# Patient Record
Sex: Female | Born: 1935 | Race: White | Hispanic: No | Marital: Married | Smoking: Never smoker
Health system: Southern US, Community
[De-identification: ages and names within clinical notes are randomized; demographics above are authoritative.]

## PROBLEM LIST (undated history)

## (undated) DIAGNOSIS — H548 Legal blindness, as defined in USA: Secondary | ICD-10-CM

## (undated) DIAGNOSIS — E039 Hypothyroidism, unspecified: Secondary | ICD-10-CM

## (undated) DIAGNOSIS — I10 Essential (primary) hypertension: Secondary | ICD-10-CM

## (undated) HISTORY — PX: TONSILLECTOMY: SUR1361

## (undated) HISTORY — PX: ABDOMINAL HYSTERECTOMY: SHX81

---

## 1998-08-20 ENCOUNTER — Ambulatory Visit (HOSPITAL_COMMUNITY): Admission: RE | Admit: 1998-08-20 | Discharge: 1998-08-20 | Payer: Self-pay | Admitting: *Deleted

## 1999-06-26 ENCOUNTER — Encounter: Payer: Self-pay | Admitting: *Deleted

## 1999-06-26 ENCOUNTER — Ambulatory Visit (HOSPITAL_COMMUNITY): Admission: RE | Admit: 1999-06-26 | Discharge: 1999-06-26 | Payer: Self-pay | Admitting: *Deleted

## 1999-12-09 ENCOUNTER — Ambulatory Visit (HOSPITAL_COMMUNITY): Admission: RE | Admit: 1999-12-09 | Discharge: 1999-12-09 | Payer: Self-pay | Admitting: *Deleted

## 2000-01-08 ENCOUNTER — Encounter: Payer: Self-pay | Admitting: *Deleted

## 2000-01-08 ENCOUNTER — Ambulatory Visit (HOSPITAL_COMMUNITY): Admission: RE | Admit: 2000-01-08 | Discharge: 2000-01-08 | Payer: Self-pay | Admitting: *Deleted

## 2000-09-27 ENCOUNTER — Inpatient Hospital Stay (HOSPITAL_COMMUNITY): Admission: EM | Admit: 2000-09-27 | Discharge: 2000-10-01 | Payer: Self-pay | Admitting: *Deleted

## 2000-09-28 ENCOUNTER — Encounter (HOSPITAL_BASED_OUTPATIENT_CLINIC_OR_DEPARTMENT_OTHER): Payer: Self-pay | Admitting: Internal Medicine

## 2001-01-03 ENCOUNTER — Ambulatory Visit (HOSPITAL_COMMUNITY): Admission: RE | Admit: 2001-01-03 | Discharge: 2001-01-03 | Payer: Self-pay | Admitting: *Deleted

## 2002-02-07 ENCOUNTER — Ambulatory Visit (HOSPITAL_COMMUNITY): Admission: RE | Admit: 2002-02-07 | Discharge: 2002-02-07 | Payer: Self-pay | Admitting: *Deleted

## 2002-11-07 ENCOUNTER — Encounter: Admission: RE | Admit: 2002-11-07 | Discharge: 2002-11-07 | Payer: Self-pay | Admitting: Internal Medicine

## 2002-11-07 ENCOUNTER — Encounter: Payer: Self-pay | Admitting: Internal Medicine

## 2003-01-23 ENCOUNTER — Encounter: Payer: Self-pay | Admitting: Internal Medicine

## 2003-01-23 ENCOUNTER — Encounter: Admission: RE | Admit: 2003-01-23 | Discharge: 2003-01-23 | Payer: Self-pay | Admitting: Internal Medicine

## 2003-02-05 ENCOUNTER — Encounter: Admission: RE | Admit: 2003-02-05 | Discharge: 2003-05-06 | Payer: Self-pay | Admitting: Internal Medicine

## 2003-02-06 ENCOUNTER — Encounter: Admission: RE | Admit: 2003-02-06 | Discharge: 2003-02-06 | Payer: Self-pay | Admitting: Internal Medicine

## 2003-02-06 ENCOUNTER — Encounter: Payer: Self-pay | Admitting: Internal Medicine

## 2003-12-05 ENCOUNTER — Ambulatory Visit (HOSPITAL_COMMUNITY): Admission: RE | Admit: 2003-12-05 | Discharge: 2003-12-05 | Payer: Self-pay | Admitting: Internal Medicine

## 2004-03-13 ENCOUNTER — Emergency Department (HOSPITAL_COMMUNITY): Admission: EM | Admit: 2004-03-13 | Discharge: 2004-03-13 | Payer: Self-pay | Admitting: Emergency Medicine

## 2007-10-01 ENCOUNTER — Inpatient Hospital Stay (HOSPITAL_COMMUNITY): Admission: EM | Admit: 2007-10-01 | Discharge: 2007-10-05 | Payer: Self-pay | Admitting: Emergency Medicine

## 2008-04-09 ENCOUNTER — Encounter: Admission: RE | Admit: 2008-04-09 | Discharge: 2008-04-09 | Payer: Self-pay | Admitting: Internal Medicine

## 2010-11-02 ENCOUNTER — Encounter: Admission: RE | Admit: 2010-11-02 | Discharge: 2010-11-02 | Payer: Self-pay | Admitting: Internal Medicine

## 2011-04-13 NOTE — Discharge Summary (Signed)
NAMEDAKOTA, STANGL                  ACCOUNT NO.:  [id number]   MEDICAL RECORD NO.:  [id number]          PATIENT TYPE:  INP   LOCATION:  5008                         FACILITY:  [facility]   PHYSICIAN:  Mark C. Ophelia Charter, M.D.    DATE OF BIRTH:  June 14, 1916   DATE OF ADMISSION:  10/01/2007  DATE OF DISCHARGE:  10/05/2007                               DISCHARGE SUMMARY   TRANSFER SUMMARY   FINAL DIAGNOSIS:  Fall with left supracondylar humerus fracture.   ADDITIONAL DIAGNOSES:  1. Patient legally blind.  2. Positive for hypertension.  3. Positive for decreased hearing acuity.   HISTORY:  This 77 year old female was leaving church with her husband  when she lost her balance.  He was holding her by her hand.  She turned,  twisted, fell, landing on her outstretched arm suffering a left  supracondylar humerus fracture.  She was brought to the emergency room  by EMS where x-rays were taken demonstrating the supracondylar  comminuted humerus fracture.   ADMISSION LABS INCLUDED:  CBC with a hemoglobin of 14.5, white count  9000, and normal platelets.  Chemistry panel was unremarkable other than  a glucose of 154.  Ulnar, radial, and median nerve exam at the time of  her injury showed that all nerves were intact.  She had good pulses.   HOSPITAL COURSE:  The patient was taken to the operating room and  underwent open reduction internal fixation of the left humerus with an  olecranon osteotomy.  Postoperatively she was taking Vicodin for pain.  Arrangements were made for home health, PT, bath aide, etc.  Her husband  works, and unfortunately there would be times that she would be home  alone, and with only one arm (she normally used a walker), but with her  legally blind condition, decreased balance, only one arm, and a walker,  she was at significant fall risk and not safe for physical therapy  evaluation.   She was kept in the hospital and remained there pending nursing home  arrangements.   Arrangements were made with [employer] for transfer  on October 05, 2007.  The patient was taken Vicodin ES for pain.  Dulcolax suppositories were ordered for bowel movement on November 5.  She was ambulating with physical therapy assistance, but needed to be  assisted because she is a high-fall risk.   Follow up with Dr. Ophelia Charter will be in one week from the time of discharge,  for splint removal and x-rays.  She has had her arm elevated on the  pillow and was tolerating a normal diet.  She was not placed on any DVT  prophylaxis due to blindness, falling history, etc.   DISCHARGE MEDICATIONS:  Same as admission medications which include:  1. Avapro 150 mg one p.o. daily.  2. Lopressor 25 mg p.o. b.i.d.  3. Zestril.  4. Prinivil 80 mg p.o. daily.  5. Synthroid 100 mcg p.o. daily.  6. KCl 10 mEq daily.  7. The only new medication that she was taking was hydrocodone 1-2      tablets p.o. q.4-6  h. p.r.n. pain.      Mark C. Ophelia Charter, M.D.  Electronically Signed     MCY/MEDQ  D:  10/05/2007  T:  10/05/2007  Job:  [id number]

## 2011-04-13 NOTE — Op Note (Signed)
NAMENINI, CAVAN                  ACCOUNT NO.:  [id number]   MEDICAL RECORD NO.:  [id number]          PATIENT TYPE:  INP   LOCATION:  1829                         FACILITY:  [facility]   PHYSICIAN:  Mark C. Ophelia Charter, M.D.    DATE OF BIRTH:  10-20-1916   DATE OF PROCEDURE:  10/01/2007  DATE OF DISCHARGE:                               OPERATIVE REPORT   PRE AND POSTOPERATIVE DIAGNOSIS:  Comminuted left supracondylar fracture  of the humerus.   PROCEDURE:  Open reduction internal fixation left supracondylar fracture  with medial and lateral plating, olecranon osteotomy.   SURGEON:  Annell Greening, MD   TOURNIQUET TIME:  An hour and 30 minutes.   PROCEDURE:  After induction of general anesthesia and orotracheal  intubation, the patient was placed prone on chest rolls.  The arm was  prepped from the shoulder all the way to the wrist, impervious  stockinette, Coban, split sheets, sterile tourniquet after extremity  sheets and sterile skin marker, Betadine Vi-drape was applied, arm was  wrapped in Esmarch tourniquet, tourniquet inflated.  Time-out procedure  was taken confirmed preoperative Ancef was given.  Midline incision was  made.  An olecranon osteotomy was performed after pre drilling with a  0.45 screw, placement of 65, 70 mm lag screw.  Acromed plates were  selected and ulnar nerve was isolated, Penrose drain was placed around  it and a suture around the Penrose to avoid excessive traction.  Nerve  was freed up, taken out of the groove.  __________  was placed in the  triceps and triceps was split medial lateral.  Release the ulnar around  the inner muscular septum was performed, fracture was reduced, held was  some K-wires above the fossa.  There was no T condylar extension, but  there was significant butterfly comminution of both medial and lateral  columns.  Lateral plate was applied first using Acromed and distal  unicortical screws were placed locking and then proximally four  screws  were placed, lagging across the fracture site which was oblique.  Next a  medial plate was fashioned with distal unicortical screws.  Care taken  avoid the penetration into the joint.  There was good cortical bite.  Proximally five more screws were placed.  Plate appeared be down the  bone; however, once all screws were locked down tight, fluoroscopy was  brought in and the arm board that had been folded that was used with  green towels and a Coban wrapped around it to make a bolster to hold the  arm at 90 to allow the elbow to flex for exposure.  The arm board was  removed and C-arm was brought in and then the proximal portion was able  be visualized.  It turned out the plate was off, slightly off the bone  but all locking screws were bicortical down tight.  After irrigation  with saline solution, triceps was repaired, ulnar nerve was transposed  anteriorly with a U-shaped 2 cm wide piece of fascia, leaving the fascia  attached to the medial epicondyle.  Subcutaneous tissue reapproximated  with  2-0 Vicryl and 3-0 Vicryl, skin staple closure, 4x4s, ABDs, Webril  and a long-arm splint with the elbow at 90, wrist at neutral.  Instrument and needle count was correct.  After repair of the triceps on  the sides, the 70 screw was switched for an 85 screw and 2-0 drill bit  was used to drill hole in the cortex of the ulna and a 20 gauge wire was  passed around it and underneath the triceps, next to the screw,  tightened down with square knot, using large needle drivers and  bending it over the round handle of the Cobb.  Screw was tightened down  securely.  Olecranon was stable, able to be flexed and extended prior to  fixing the olecranon and there was good stability of the fracture site  and also of the olecranon with flexion/extension up.  Closure was  performed as described above and splint applied as described.      Mark C. Ophelia Charter, M.D.  Electronically Signed     MCY/MEDQ  D:   10/01/2007  T:  10/02/2007  Job:  [id number]

## 2011-04-16 NOTE — H&P (Signed)
[facility]  Patient:    Cynthia Lynn, Cynthia Lynn                           MRN: [id number] Adm. Date:  09/27/00 Attending:  Lilia Pro, M.D.                         History and Physical  CHIEF COMPLAINT:  Chest pain.  HISTORY OF PRESENT ILLNESS:  This is an 77 year old white female who was under my primary care and came to my office today with a two-day history of chest pain.  She describes this as a tight sensation across her chest and radiating to her left arm.  She has never had pain like this before and denies any shortness of breath, diaphoresis or nausea or vomiting.  She says this is a constant pain with no alleviating or aggravating factors.  She also complains of a severe sore throat and sinus congestion which started about the same time.  PAST MEDICAL HISTORY:  Includes gastroesophageal reflux disease, hiatal hernia, osteopenia, diverticulosis, hypertension, and allergic rhinitis.  PAST SURGICAL HISTORY:  Includes a hysterectomy, a cyst removal of her right lung 50 years ago, tonsillectomy.  PROCEDURES:  Include an EGD in February 2001 with dilatation of her esophagus per Dr. Virginia Rochester.  Colonoscopy 2001 that was negative.  CT of the abdomen in July 2000, negative.  FAMILY AND SOCIAL HISTORY:  She lives with her husband and is independent with her ADLs, driving and babysitting her granddaughter.  She does not smoke nor does she drink.  Family history shows coronary artery disease in her mother who died at age 52.  Father died at age 20 from heart disease.  Her sister has liver cancer.  REVIEW OF SYSTEMS:  GENERAL:  Mild fatigue.  HEENT:  Positive for nasal congestion and sore throat.  CARDIOVASCULAR:  Positive for chest tightness. RESPIRATORY:  Negative.  GASTROINTESTINAL:  Negative.  GENITOURINARY: Negative.  INFECTIOUS DISEASE:  Negative.  MUSCULOSKELETAL:  Negative.  All other systems negative.  PHYSICAL EXAMINATION:  VITAL SIGNS:  Blood  pressure 160/82, she is afebrile.  Her weight is 178, pulse is 75.  GENERAL:  This is a well-developed, white female in no apparent distress.  HEENT:  Pupils, equal, round, reactive to light.  Extraocular movements intact.  Sclerae clear.  Oropharynx with mild redness of her pharynx, otherwise clear.  TMs clear bilaterally.  NECK:  No masses palpable, supple.  LUNGS:  Clear to auscultation bilaterally.  Nonlabored.  ABDOMEN:  Nondistended, nontender, soft, positive bowel sounds.  No hepatosplenomegaly.  Positive ventral hernia.  EXTREMITIES:  No cyanosis.  No clubbing.  No edema.  NEUROLOGICAL:  Cranial nerves II-XII intact.  Strength 5+ throughout.  Normal gait.  LABORATORY DATA:  Shows an ECG with PACs and an LVH with some ST segment changes but no significant change since her last ECG earlier this year.  IMPRESSION AND PLAN: IMPRESSION AND PLAN:  Chest pain.  I would consider this to be an acute coronary syndrome with mild to moderate risk.  I will go ahead and admit her to a telemetry bed.  Check serial cardiac enzymes, place a nitroglycerin patch on her as well as start a beta blocker, aspirin and Lovenox at therapeutic dose.  I will give her doxycycline for her sinus infection and consult Dr. Lucas Mallow to see the patient. DD:  09/27/00 TD:  09/27/00 Job: [id number]

## 2011-04-16 NOTE — Consult Note (Signed)
[facility]  Patient:    Cynthia Lynn, Cynthia Lynn                         MRN: [id number] Proc. Date: 09/28/00 Adm. Date:  [id number] Attending:  Lilia Pro D                          Consultation Report  PRIMARY CARE PHYSICIAN:  Lilia Pro, M.D.  I appreciate the opportunity to participate in the care of this very nice patient, the 77 year old Cynthia Lynn, date of birth 08/01/1916, by providing consultative services, at Dr. Doreen Beam request, in regard to Cynthia Lynn chest pain.  The patient presented to the office complaining of a 2 day history of chest discomfort described as a tight sensation across the chest with radiation to the left arm. She says that the pain was not accompanied by shortness of breath, diaphoresis, nausea or vomiting but was moderate in severity, occurred in the context of a recent sore throat and sinus congestion, and was not modified by physical activity or position.  PAST MEDICAL HISTORY:  Gastroesophageal reflux disease with dilatation of the esophagus in February of this year by Dr. Virginia Rochester, hiatal hernia, osteopenia, diverticulosis, allergic rhinitis and hypertension. She has had a hysterectomy and also had a cyst removed from her right lung 50 years ago. There is a remote tonsillectomy. She says that all her family have recently had respiratory tract infections including both her daughters and her husband.  FAMILY HISTORY:  Her mother died at 43 and had coronary artery disease. Her father died at 52 from heart disease. A sister has liver cancer.  SOCIAL HISTORY:  She lives at home with her husband. She is busy baby sitting for her granddaughter. They live in a house which has 2 levels and 3 steps to enter the home. There is 2 railing and she can stay on ground level if needed.  REVIEW OF SYSTEMS:  CONSTITUTIONAL:  She denies documented fevers at home but feels that she may have had some fever within the last day or two.  She says that her legs occasionally cramp when she walks. EYES:  No diplopia or blurring. She does have impaired vision overall and wears reading glasses. ENT:  She has mild deafness. She has her own teeth. CARDIOVASCULAR:  See the history of the present illness. No PND or orthopnea. RESPIRATORY:  The recent respiratory tract infection with sore throat is noted above. She denies dyspnea on exertion ordinarily and has never smoked. GI:  Occasional symptoms of gastroesophageal reflux. Also occasional constipation and diarrhea last week. GU:  Ongoing problem with urgency, without dysuria. 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  SKIN/BREASTS:  No rash or nodule. NEUROLOGIC: No faintness or syncope. ENDOCRINE:  No known diabetes or thyroid disease. ALLERGIC LYMPHATIC:  No known medication or food allergy.  PHYSICAL EXAMINATION:  VITAL SIGNS:  Blood pressure on arriving at the [facility] is 210/108 with temperature 100.2, pulse 80 and respirations 16. Subsequently her blood pressure has dropped to the 110 to 120 systolic range. Weight 178 pounds, height 5 feet 5 inches.  GENERAL:  She is a well-developed, well-nourished woman who looks younger than her stated age of 76. She is mildly deaf. She is oriented to person, place and time. Her mood and affect are appropriate.  HEENT:  Her conjunctiva and lens reveal no xanthelasma, icterus or arcus senilis. Her teeth  are her own and in fairly good repair. Her tongue is somewhat dry otherwise unremarkable. There is no pallor or cyanosis of the oral mucosa.  NECK:  Supple and symmetric. Trachea is midline and mobile. There is no palpable thyromegaly or cervical node, no carotid bruit or JVD.  LUNGS:  Her respiratory effort is normal at rest. Her lungs reveal minimal prolongation of forced expiratory time without wheezes, rales or rhonchi.  BACK:  Straight and nontender. There is no kyphosis or scoliosis. Her gait is not tested. Her muscle  strength and tone are age appropriate.  HEART:  The cardiac apical impulse is cryptic. The left border of cardiac dullness is within the left anterior axillary line. The heart rhythm is regular and is normal. There is 2-3/6 moderately late apical systolic murmur and a grade 2/6 systolic murmur at the right upper sternal border. The first sound is single and the splitting of the second is uncertain.  EXTREMITIES:  Her digits and nails reveal no clubbing or cyanosis.  SKIN/SUBCUTANEOUS TISSUE:  Reveal no stasis dermatitis or ulcer.  ABDOMEN:  Mildly protuberant and nontender. There is no palpable enlargement of liver or spleen. There is no aortic bruit. The femoral arteries are without bruits. The pedal pulses are difficult to feel through the 1-2+ edema. There is no evidence of ischemia of the feet.  Not mentioned in the review of systems above is that she has a long term problem with ankle edema.  LABORATORY DATA:  Several sensitive cardiac enzymes which are negative. However, she has had 2 EKGs both of which show possible prior anteroseptal or at least septal myocardial infarction.  Cynthia Lynn chest pain is atypical, and I think it is unlikely to represent unstable angina. 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 It would be appropriate to screen her for such a problem with a Persantine cardiolite test, which I have scheduled for her. Meanwhile, of course, she should continue on appropriate treatment for her respiratory tract infection. If she develops additional chest pain especially if associated with new ST abnormalities in the electrocardiogram then a more aggressive approach would become appropriate.  I again appreciate the opportunity to participate in the care of this nice woman. The results of her test will of course determine our further course of action. DD:   09/28/00 TD:  09/28/00 Job: [id number]

## 2011-04-16 NOTE — Discharge Summary (Signed)
[facility]  Patient:    Cynthia Lynn, Cynthia Lynn                         MRN: [id number] Adm. Date:  [id number] Disc. Date: [id number] Attending:  Lilia Pro D                           Discharge Summary  ADMISSION DIAGNOSES: 1. Chest pain. 2. Upper respiratory infection.  DISCHARGE DIAGNOSES: 1. Chest pain with negative cardiac work-up. 2. Upper respiratory infection.  PROCEDURE:  Stress Cardiolite scan which was normal.  HISTORY OF PRESENT ILLNESS:  This is an 77 year old white female who under my primary care came to the office with a two day history of chest pain which she described as a tightness across her chest and radiating to her left arm.  She stated that she had never had any pain like this but denied any shortness of breath, diaphoresis or nausea and vomiting.  PHYSICAL EXAMINATION:  On physical exam, she was noted to have a very soft systolic murmur.  Otherwise, lungs were clear.  The rest of her exam was benign.  HOSPITAL COURSE:  The patient was admitted to [facility] to a telemetry bed. Serial cardiac enzymes were done which were negative and cardiology was consulted.  Dr. Lucas Mallow saw the patient and felt that the stress Cardiolite exam was indicated because the Q-waves on her EKG indicated possible old infarct and because of her risk factors.  A stress Cardiolite scan was done.  This was read as negative.  The patient stated that her symptoms improved.  She denied any chest pain on the day of discharge and said her upper respiratory symptoms were much improved on Augmentin.  She was given a five day course of Augmentin p.o.  She was stable on discharge.  PLAN:  Follow-up will be with Dr. Lilia Pro in two to three weeks. The patient is going to have cataract surgery on Tuesday, October 04, 2000, which she is cleared for. DD:  10/01/00 TD:  10/02/00 Job: [id number]

## 2011-09-07 LAB — DIFFERENTIAL
Basophils Relative: 1
Eosinophils Absolute: 0
Eosinophils Relative: 1
Lymphocytes Relative: 12
Monocytes Absolute: 0.3
Monocytes Relative: 3

## 2011-09-07 LAB — CBC
HCT: 42.4
MCHC: 34.2
WBC: 9

## 2011-09-07 LAB — I-STAT 8, (EC8 V) (CONVERTED LAB)
Bicarbonate: 25.4 — ABNORMAL HIGH
Glucose, Bld: 154 — ABNORMAL HIGH
HCT: 44
Operator id: 285491
Sodium: 137
TCO2: 27

## 2011-09-07 LAB — POCT I-STAT CREATININE: Operator id: 285491

## 2012-04-08 ENCOUNTER — Emergency Department (INDEPENDENT_AMBULATORY_CARE_PROVIDER_SITE_OTHER)
Admission: EM | Admit: 2012-04-08 | Discharge: 2012-04-08 | Disposition: A | Discharge status: Home / Self Care | Payer: Medicare Other | Source: Home / Self Care | Attending: Emergency Medicine | Admitting: Emergency Medicine

## 2012-04-08 ENCOUNTER — Encounter (HOSPITAL_COMMUNITY): Payer: Self-pay | Admitting: Emergency Medicine

## 2012-04-08 DIAGNOSIS — I10 Essential (primary) hypertension: Secondary | ICD-10-CM

## 2012-04-08 DIAGNOSIS — N390 Urinary tract infection, site not specified: Secondary | ICD-10-CM

## 2012-04-08 HISTORY — DX: Essential (primary) hypertension: I10

## 2012-04-08 LAB — POCT I-STAT, CHEM 8
BUN: 16 mg/dL (ref 6–23)
Chloride: 103 mEq/L (ref 96–112)
Creatinine, Ser: 0.9 mg/dL (ref 0.50–1.10)
HCT: 48 % — ABNORMAL HIGH (ref 36.0–46.0)
Potassium: 4 mEq/L (ref 3.5–5.1)

## 2012-04-08 LAB — POCT URINALYSIS DIP (DEVICE)
Nitrite: POSITIVE — AB
Specific Gravity, Urine: 1.01 (ref 1.005–1.030)

## 2012-04-08 MED ORDER — LISINOPRIL 10 MG PO TABS: 20.0000 mg | ORAL_TABLET | Freq: Every day | ORAL | Status: DC

## 2012-04-08 MED ORDER — CLONIDINE HCL 0.1 MG PO TABS
0.1000 mg | ORAL_TABLET | Freq: Once | ORAL | Status: AC
  Administered 2012-04-08: 0.1 mg via ORAL

## 2012-04-08 MED ORDER — CEPHALEXIN 500 MG PO CAPS: 500.0000 mg | ORAL_CAPSULE | Freq: Three times a day (TID) | ORAL | Status: AC

## 2012-04-08 MED ORDER — CLONIDINE HCL 0.1 MG PO TABS
ORAL_TABLET | ORAL | Status: AC
  Filled 2012-04-08: qty 1

## 2012-04-08 NOTE — Discharge Instructions (Signed)
Double your dose of lisinopril to 20 mg daily (2 pills).  See your primary care doctor this week for a blood pressure recheck.  Finish up your entire prescription of antibiotics and get another urine culture afterwards.

## 2012-04-08 NOTE — ED Provider Notes (Signed)
Chief Complaint  Patient presents with  . Blood Pressure Check    History of Present Illness:   [name] is a 78 year old female who has had a one-week history of feeling tired, fatigue, and rundown. She also notes urinary frequency and at times incontinence. She's had no dysuria, fever, chills, or lower back pain. She called the fire department this morning because of her fatigue and tiredness and they checked her blood pressure and found it to be 142/92, and recommended that she get checked out. She is on blood pressure medication but doesn't think her blood pressures usually that high. She denies any abdominal pain, nausea, or vomiting. She denies any shortness of breath, PND, orthopnea. She does have ankle swelling. She also has noted some tightness in her chest over the past 3 or 4 weeks. This can last for hours at a time. It's not exertional and not associated with shortness of breath, nausea, or diaphoresis. She denies any PND or orthopnea.  Review of Systems:  Other than noted above, the patient denies any of the following symptoms. Systemic:  No fever, chills, sweats, fatigue, myalgias, headache, or anorexia. Eye:  No redness, pain or drainage. ENT:  No earache, nasal congestion, rhinorrhea, sinus pressure, or sore throat. Lungs:  No cough, sputum production, wheezing, shortness of breath.  Cardiovascular:  No chest pain, palpitations, or syncope. GI:  No nausea, vomiting, abdominal pain or diarrhea. GU:  No dysuria, frequency, or hematuria. Skin:  No rash or pruritis.  PMFSH:  Past medical history, family history, social history, meds, and allergies were reviewed.  Physical Exam:   Vital signs:  BP 183/78  Pulse 68  Temp(Src) 97.8 F (36.6 C) (Oral)  Resp 18  SpO2 98% Filed Vitals:   04/08/12 1758 04/08/12 1802 04/08/12 1919  BP: 189/94 195/66 183/78  Pulse: 62  68  Temp: 97.8 F (36.6 C)    TempSrc: Oral    Resp: 18  18  SpO2: 98%  98%   General:  Alert, in no  distress. Eye:  PERRL, full EOMs.  Lids and conjunctivas were normal. ENT:  TMs and canals were normal, without erythema or inflammation.  Nasal mucosa was clear and uncongested, without drainage.  Mucous membranes were moist.  Pharynx was clear, without exudate or drainage.  There were no oral ulcerations or lesions. Neck:  Supple, no adenopathy, tenderness or mass. Thyroid was normal. Lungs:  No respiratory distress.  Lungs were clear to auscultation, without wheezes, rales or rhonchi.  Breath sounds were clear and equal bilaterally. Heart:  Regular rhythm, without gallops, murmers or rubs. Abdomen:  Soft, flat, and non-tender to palpation.  No hepatosplenomagaly or mass. Skin:  Clear, warm, and dry, without rash or lesions. Neuro exam:  Neurological examination: The patient is alert and oriented x3. Speech is clear, fluent, and appropriate. Cranial nerves are intact. There is no pronator drift and finger to nose was normal. Muscle strength, sensation, and DTRs are normal. Babinskis are downgoing.    Date: 04/08/2012  Rate: 67  Rhythm: normal sinus rhythm  QRS Axis: normal  Intervals: PR prolonged  ST/T Wave abnormalities: nonspecific ST changes  Conduction Disutrbances:none  Narrative Interpretation: Sinus rhythm with first degree AV block, left ventricular hypertrophy with repolarization abnormality, otherwise normal. Unchanged from a previous EKG of November 2001.  Old EKG Reviewed: unchanged    Labs:   Results for orders placed during the hospital encounter of 04/08/12  POCT URINALYSIS DIP (DEVICE)      Component  Value Range   Glucose, UA NEGATIVE  NEGATIVE (mg/dL)   Bilirubin Urine NEGATIVE  NEGATIVE    Ketones, ur NEGATIVE  NEGATIVE (mg/dL)   Specific Gravity, Urine 1.010  1.005 - 1.030    Hgb urine dipstick TRACE (*) NEGATIVE    pH 7.5  5.0 - 8.0    Protein, ur NEGATIVE  NEGATIVE (mg/dL)   Urobilinogen, UA 0.2  0.0 - 1.0 (mg/dL)   Nitrite POSITIVE (*) NEGATIVE     Leukocytes, UA SMALL (*) NEGATIVE   POCT I-STAT, CHEM 8      Component Value Range   Sodium 137  135 - 145 (mEq/L)   Potassium 4.0  3.5 - 5.1 (mEq/L)   Chloride 103  96 - 112 (mEq/L)   BUN 16  6 - 23 (mg/dL)   Creatinine, Ser 1.32  0.50 - 1.10 (mg/dL)   Glucose, Bld 440 (*) 70 - 99 (mg/dL)   Calcium, Ion 1.02  7.25 - 1.32 (mmol/L)   TCO2 26  0 - 100 (mmol/L)   Hemoglobin 16.3 (*) 12.0 - 15.0 (g/dL)   HCT 36.6 (*) 44.0 - 46.0 (%)     Other Labs Obtained at Urgent Care Center:  A urine culture was obtained.  Results are pending at this time and we will call about any positive results.  Course in Urgent Care Center:   She was given clonidine 0.1 mg by mouth and tolerated this well without any immediate side effects. Her blood pressure came down slightly as demonstrated above.   Assessment:  The primary encounter diagnosis was UTI (lower urinary tract infection). A diagnosis of Hypertension was also pertinent to this visit. I think or tiredness and fatigue is due to her urinary tract infection. Her blood pressure appears to be very labile. I don't think this is the cause of her symptoms. It does need better control. I don't see any evidence of cardiac disease.  Plan:   1.  The following meds were prescribed:   New Prescriptions   CEPHALEXIN (KEFLEX) 500 MG CAPSULE    Take 1 capsule (500 mg total) by mouth 3 (three) times daily.   2.  The patient was instructed in symptomatic care and handouts were given. 3.  The patient was told to return if becoming worse in any way, if no better in 3 or 4 days, and given some red flag symptoms that would indicate earlier return. The patient was told to increase her lisinopril to 20 mg per day, followup with her primary care physician next week for a blood pressure, and complete her course of antibiotics with a repeat culture after finishing the antibiotics.    Reuben Likes, MD 04/08/12 [id number]

## 2012-04-08 NOTE — ED Notes (Signed)
Pt was advised to go to her doctor this morning due to a blood pressure of 142/94. Pt blood pressure is now even more elevated. Pt also c/o dysuria and increased incontinence.

## 2012-04-11 LAB — URINE CULTURE
Colony Count: >=100000
Culture  Setup Time: 201305120206
Special Requests: NORMAL

## 2012-04-13 NOTE — ED Notes (Signed)
Urine culture: > 100,000 colonies E. Coli. Pt. adequately treated with Keflex. Cynthia Lynn 04/13/2012  

## 2013-01-17 ENCOUNTER — Encounter (HOSPITAL_COMMUNITY): Payer: Self-pay | Admitting: Emergency Medicine

## 2013-01-17 ENCOUNTER — Emergency Department (HOSPITAL_COMMUNITY): Payer: Medicare Other

## 2013-01-17 ENCOUNTER — Inpatient Hospital Stay (HOSPITAL_COMMUNITY)
Admission: EM | Admit: 2013-01-17 | Discharge: 2013-01-22 | DRG: 690 | Disposition: A | Discharge status: Home Health | Payer: Medicare Other | Attending: Internal Medicine | Admitting: Internal Medicine

## 2013-01-17 DIAGNOSIS — I498 Other specified cardiac arrhythmias: Secondary | ICD-10-CM | POA: Diagnosis present

## 2013-01-17 DIAGNOSIS — A498 Other bacterial infections of unspecified site: Secondary | ICD-10-CM | POA: Diagnosis present

## 2013-01-17 DIAGNOSIS — N390 Urinary tract infection, site not specified: Principal | ICD-10-CM | POA: Diagnosis present

## 2013-01-17 DIAGNOSIS — H548 Legal blindness, as defined in USA: Secondary | ICD-10-CM | POA: Diagnosis present

## 2013-01-17 DIAGNOSIS — E039 Hypothyroidism, unspecified: Secondary | ICD-10-CM | POA: Diagnosis present

## 2013-01-17 DIAGNOSIS — K5289 Other specified noninfective gastroenteritis and colitis: Secondary | ICD-10-CM | POA: Diagnosis present

## 2013-01-17 DIAGNOSIS — N179 Acute kidney failure, unspecified: Secondary | ICD-10-CM | POA: Diagnosis present

## 2013-01-17 DIAGNOSIS — D72829 Elevated white blood cell count, unspecified: Secondary | ICD-10-CM | POA: Diagnosis present

## 2013-01-17 DIAGNOSIS — E860 Dehydration: Secondary | ICD-10-CM | POA: Diagnosis present

## 2013-01-17 DIAGNOSIS — Z79899 Other long term (current) drug therapy: Secondary | ICD-10-CM

## 2013-01-17 DIAGNOSIS — Z993 Dependence on wheelchair: Secondary | ICD-10-CM

## 2013-01-17 DIAGNOSIS — Z7982 Long term (current) use of aspirin: Secondary | ICD-10-CM

## 2013-01-17 DIAGNOSIS — I10 Essential (primary) hypertension: Secondary | ICD-10-CM | POA: Diagnosis present

## 2013-01-17 HISTORY — DX: Legal blindness, as defined in USA: H54.8

## 2013-01-17 HISTORY — DX: Hypothyroidism, unspecified: E03.9

## 2013-01-17 LAB — BASIC METABOLIC PANEL
BUN: 42 mg/dL — ABNORMAL HIGH (ref 6–23)
Calcium: 9.7 mg/dL (ref 8.4–10.5)
Creatinine, Ser: 1.12 mg/dL — ABNORMAL HIGH (ref 0.50–1.10)
GFR calc Af Amer: 47 mL/min — ABNORMAL LOW (ref >90)
GFR calc non Af Amer: 41 mL/min — ABNORMAL LOW (ref >90)
Glucose, Bld: 143 mg/dL — ABNORMAL HIGH (ref 70–99)

## 2013-01-17 LAB — CBC WITH DIFFERENTIAL/PLATELET
Basophils Relative: 0 % (ref 0–1)
Eosinophils Absolute: 0.1 10*3/uL (ref 0.0–0.7)
Eosinophils Relative: 1 % (ref 0–5)
HCT: 42 % (ref 36.0–46.0)
Hemoglobin: 15 g/dL (ref 12.0–15.0)
Lymphs Abs: 1.2 10*3/uL (ref 0.7–4.0)
MCH: 32.8 pg (ref 26.0–34.0)
MCHC: 35.7 g/dL (ref 30.0–36.0)
MCV: 91.7 fL (ref 78.0–100.0)
Monocytes Absolute: 1.3 10*3/uL — ABNORMAL HIGH (ref 0.1–1.0)
Monocytes Relative: 7 % (ref 3–12)
Neutrophils Relative %: 86 % — ABNORMAL HIGH (ref 43–77)
RBC: 4.58 MIL/uL (ref 3.87–5.11)

## 2013-01-17 LAB — URINALYSIS, ROUTINE W REFLEX MICROSCOPIC
Ketones, ur: 15 mg/dL — AB
Nitrite: POSITIVE — AB
Specific Gravity, Urine: 1.021 (ref 1.005–1.030)
Urobilinogen, UA: 1 mg/dL (ref 0.0–1.0)

## 2013-01-17 LAB — URINE MICROSCOPIC-ADD ON

## 2013-01-17 MED ORDER — SODIUM CHLORIDE 0.9 % IV SOLN
INTRAVENOUS | Status: DC
  Administered 2013-01-17: 22:00:00 via INTRAVENOUS

## 2013-01-17 MED ORDER — IOHEXOL 300 MG/ML  SOLN
80.0000 mL | Freq: Once | INTRAMUSCULAR | Status: AC | PRN
  Administered 2013-01-17: 80 mL via INTRAVENOUS

## 2013-01-17 MED ORDER — DEXTROSE 5 % IV SOLN
1.0000 g | Freq: Once | INTRAVENOUS | Status: AC
  Administered 2013-01-18: 1 g via INTRAVENOUS
  Filled 2013-01-17: qty 10

## 2013-01-17 MED ORDER — SODIUM CHLORIDE 0.9 % IV BOLUS (SEPSIS)
500.0000 mL | Freq: Once | INTRAVENOUS | Status: AC
  Administered 2013-01-17: 500 mL via INTRAVENOUS

## 2013-01-17 MED ORDER — IOHEXOL 300 MG/ML  SOLN
50.0000 mL | Freq: Once | INTRAMUSCULAR | Status: AC | PRN
  Administered 2013-01-17: 50 mL via ORAL

## 2013-01-17 NOTE — ED Provider Notes (Signed)
History     CSN: [id number]  Arrival date & time 01/17/13  5621   First MD Initiated Contact with Patient 01/17/13 1954      Chief Complaint  Patient presents with  . Weakness  . Diarrhea  . Abdominal Pain    (Consider location/radiation/quality/duration/timing/severity/associated sxs/prior treatment) HPI Comments: TAWANNA FUNK is a 79 y.o. female who is here for abrupt onset of profuse diarrhea. She does not know the color of the diarrhea, because she is legally blind.  She has had mild, ongoing, right lower quadrant abdominal pain for several weeks. She saw her doctor about it last month, and was told that it was her "diverticulitis". She had one episode of vomiting tonight after being nauseated. She denies fever, chills, cough, shortness of breath, chest pain, back pain, or dizziness. She was too weak to walk after she had the diarrhea tonight. She called EMS, for transport here to be evaluated. She does not know of any toxic or sick exposures. There are no other modifying factors.  Patient is a 79 y.o. female presenting with weakness, diarrhea, and abdominal pain. The history is provided by the patient.  Weakness Associated symptoms include abdominal pain.  Diarrhea Associated symptoms: abdominal pain   Abdominal Pain Associated symptoms: diarrhea     Past Medical History  Diagnosis Date  . Hypertension     History reviewed. No pertinent past surgical history.  History reviewed. No pertinent family history.  History  Substance Use Topics  . Smoking status: Never Smoker   . Smokeless tobacco: Not on file  . Alcohol Use: No    OB History   Grav Para Term Preterm Abortions TAB SAB Ect Mult Living                  Review of Systems  Gastrointestinal: Positive for abdominal pain and diarrhea.  Neurological: Positive for weakness.  All other systems reviewed and are negative.    Allergies  Review of patient's allergies indicates no known allergies.  Home  Medications   Current Outpatient Rx  Name  Route  Sig  Dispense  Refill  . aspirin 325 MG tablet   Oral   Take 325 mg by mouth daily.         Marland Kitchen levothyroxine (SYNTHROID, LEVOTHROID) 50 MCG tablet   Oral   Take 50 mcg by mouth daily.         Marland Kitchen lisinopril (PRINIVIL,ZESTRIL) 10 MG tablet   Oral   Take 2 tablets (20 mg total) by mouth daily.   60 tablet   0   . metoprolol (LOPRESSOR) 50 MG tablet   Oral   Take 50 mg by mouth 2 (two) times daily.         . potassium & sodium phosphates (PHOS-NAK) 280-160-250 MG PACK   Oral   Take 1 packet by mouth every morning.           BP 163/49  Pulse 53  Temp(Src) 97 F (36.1 C) (Rectal)  Resp 16  SpO2 100%  Physical Exam  Nursing note and vitals reviewed. Constitutional: She is oriented to person, place, and time. She appears well-developed.  Elderly, frail  HENT:  Head: Normocephalic and atraumatic.  Eyes: Conjunctivae and EOM are normal. Pupils are equal, round, and reactive to light.  Neck: Normal range of motion and phonation normal. Neck supple.  Cardiovascular: Normal rate, regular rhythm and intact distal pulses.   Pulmonary/Chest: Effort normal and breath sounds normal. She exhibits no  tenderness.  Abdominal: Soft. She exhibits no distension. There is no tenderness. There is no guarding.  Hyperactive bowel sounds, soft, mild, right lower quadrant abdominal tenderness.  Musculoskeletal: Normal range of motion.  Neurological: She is alert and oriented to person, place, and time. She has normal strength. She exhibits normal muscle tone.  Skin: Skin is warm and dry.  Psychiatric: She has a normal mood and affect. Her behavior is normal. Judgment and thought content normal.    ED Course  Procedures (including critical care time)  Emergency department treatment: IV fluid was entered.  IV, Rocephin, for UTI.  CT scan ordered to rule out diverticulitis   Reevaluation: 22:55.-She is tolerating her oral contrast,  without vomiting.  Case discussed with hospitalist, to arrange admission.   Date: 09/15/2012  Rate: 47  Rhythm: sinus bradycardia  QRS Axis: normal  PR and QT Intervals: normal  ST/T Wave abnormalities: normal  PR and QRS Conduction Disutrbances:nonspecific intraventricular conduction delay  Narrative Interpretation:   Old EKG Reviewed: changes noted-slight changing QRS, nonspecific     Labs Reviewed  CBC WITH DIFFERENTIAL - Abnormal; Notable for the following:    WBC 18.3 (*)    Neutrophils Relative 86 (*)    Neutro Abs 15.7 (*)    Lymphocytes Relative 7 (*)    Monocytes Absolute 1.3 (*)    All other components within normal limits  BASIC METABOLIC PANEL - Abnormal; Notable for the following:    Glucose, Bld 143 (*)    BUN 42 (*)    Creatinine, Ser 1.12 (*)    GFR calc non Af Amer 41 (*)    GFR calc Af Amer 47 (*)    All other components within normal limits  URINALYSIS, ROUTINE W REFLEX MICROSCOPIC - Abnormal; Notable for the following:    Color, Urine RED (*)    APPearance TURBID (*)    Hgb urine dipstick SMALL (*)    Bilirubin Urine MODERATE (*)    Ketones, ur 15 (*)    Protein, ur 100 (*)    Nitrite POSITIVE (*)    Leukocytes, UA LARGE (*)    All other components within normal limits  URINE MICROSCOPIC-ADD ON - Abnormal; Notable for the following:    Bacteria, UA MANY (*)    Casts HYALINE CASTS (*)    All other components within normal limits  URINE CULTURE   Ct Abdomen Pelvis W Contrast  01/18/2013  *RADIOLOGY REPORT*  Clinical Data: Diarrhea, abdominal pain  CT ABDOMEN AND PELVIS WITH CONTRAST  Technique:  Multidetector CT imaging of the abdomen and pelvis was performed following the standard protocol during bolus administration of intravenous contrast.  Contrast: 80mL OMNIPAQUE IOHEXOL 300 MG/ML  SOLN  Comparison: None.  Findings: Mild right lung base opacity and trace fluid. Cardiomegaly.  Coarse mitral annular calcification.  Small hiatal hernia.   Unremarkable liver, spleen, pancreas, right adrenal gland.  There is a lateral limb left adrenal nodule measuring 1 cm, indeterminate.  Symmetric renal enhancement.  There is mild urothelial hyperenhancement of the right renal pelvis without hydroureter.  Pelvic floor laxity.  Advanced colonic diverticulosis. Circumferential descending colonic wall thickening with mucosal hyperenhancement.  Limited evaluation of the transverse colon due to decompressed state.  No bowel obstruction.  No free intraperitoneal air or fluid.  No lymphadenopathy.  Advanced atherosclerotic disease of the aorta and branch vessels.  Mild bladder wall thickening is nonspecific given incomplete distension.  Absent uterus.  No adnexal mass.  Osteopenia and multilevel degenerative  change.  No acute osseous finding.  IMPRESSION: Wall thickening and mucosal hyperenhancement of the descending colon.  Differential includes ischemic, infectious, and inflammatory colitis.  Mild bladder wall thickening and urothelial hyperenhancement of the right renal pelvis.  May reflect an ascending infection.  Correlate with urinalysis.  Mild right lung base opacity; scarring versus infiltrate. Trace associated pleural thickening versus fluid.  Small hiatal hernia.  Coarse mitral annular calcification and mild cardiomegaly.  Advanced atherosclerotic disease of the aorta and branch vessels.   Original Report Authenticated By: Jearld Lesch, M.D.    Dg Chest Port 1 View  01/17/2013  *RADIOLOGY REPORT*  Clinical Data: Weakness and diarrhea.  PORTABLE CHEST - 1 VIEW  Comparison: One-view chest 10/01/2007.  Findings: The heart size is normal.  Emphysematous changes again noted.  No focal airspace disease is evident.  The aorta is tortuous. Advanced degenerative changes are noted in the shoulders, worse on the left.  Biapical pleural parenchymal scarring is stable.  IMPRESSION:  1.  No acute cardiopulmonary disease. 2.  Emphysema.   Original Report Authenticated  By: Marin Roberts, M.D.    Nursing notes, applicable records and vitals reviewed.  Radiologic Images/Reports reviewed.   1. UTI (lower urinary tract infection)   2. Colitis, nonspecific       MDM  Patient with diarrhea and evident, colitis, which is nonspecific. She also apparently, has incidental urinary tract infection. The patient is elderly and debilitated. She was too weak to walk at home. I think she would be a risk for discharge; for worsening symptoms, and possible injury. She would be best served by inpatient observation continued parenteral fluids, and consideration of spectrum antibiotic treatments.      Plan: Admit  Flint Melter, MD 01/18/13 [id number]

## 2013-01-17 NOTE — ED Notes (Addendum)
Pt cleaned upon arrival. Pt had loss of bowels with diarrhea

## 2013-01-17 NOTE — ED Notes (Signed)
Pt's daughter Arline Asp at [phone]; called if pt get discharged, admitted or any question.

## 2013-01-17 NOTE — ED Notes (Signed)
CT notified that pt finished with first and half of the second.

## 2013-01-17 NOTE — ED Notes (Signed)
Pt to ED from home via EMS with c/o generalized weakness, abdominal pain, nausea vomiting onset 1630 today. CBG 144, BP 116/60, HR 46, O2 98-100% on room air.

## 2013-01-18 ENCOUNTER — Encounter (HOSPITAL_COMMUNITY): Payer: Self-pay | Admitting: Internal Medicine

## 2013-01-18 DIAGNOSIS — E039 Hypothyroidism, unspecified: Secondary | ICD-10-CM | POA: Diagnosis present

## 2013-01-18 DIAGNOSIS — N390 Urinary tract infection, site not specified: Secondary | ICD-10-CM | POA: Diagnosis present

## 2013-01-18 DIAGNOSIS — E860 Dehydration: Secondary | ICD-10-CM | POA: Diagnosis present

## 2013-01-18 DIAGNOSIS — K529 Noninfective gastroenteritis and colitis, unspecified: Secondary | ICD-10-CM | POA: Diagnosis present

## 2013-01-18 DIAGNOSIS — I10 Essential (primary) hypertension: Secondary | ICD-10-CM | POA: Diagnosis present

## 2013-01-18 DIAGNOSIS — D72829 Elevated white blood cell count, unspecified: Secondary | ICD-10-CM | POA: Diagnosis present

## 2013-01-18 LAB — CBC WITH DIFFERENTIAL/PLATELET
Eosinophils Relative: 0 % (ref 0–5)
Hemoglobin: 12 g/dL (ref 12.0–15.0)
Lymphocytes Relative: 14 % (ref 12–46)
Lymphs Abs: 2 10*3/uL (ref 0.7–4.0)
MCV: 90.4 fL (ref 78.0–100.0)
Monocytes Relative: 7 % (ref 3–12)
Neutrophils Relative %: 79 % — ABNORMAL HIGH (ref 43–77)
Platelets: 177 10*3/uL (ref 150–400)
RBC: 3.74 MIL/uL — ABNORMAL LOW (ref 3.87–5.11)
WBC: 14.3 10*3/uL — ABNORMAL HIGH (ref 4.0–10.5)

## 2013-01-18 LAB — COMPREHENSIVE METABOLIC PANEL
AST: 20 U/L (ref 0–37)
Albumin: 2.7 g/dL — ABNORMAL LOW (ref 3.5–5.2)
Alkaline Phosphatase: 58 U/L (ref 39–117)
BUN: 41 mg/dL — ABNORMAL HIGH (ref 6–23)
Potassium: 4.4 mEq/L (ref 3.5–5.1)
Sodium: 137 mEq/L (ref 135–145)
Total Protein: 5.5 g/dL — ABNORMAL LOW (ref 6.0–8.3)

## 2013-01-18 LAB — TSH: TSH: 0.314 u[IU]/mL — ABNORMAL LOW (ref 0.350–4.500)

## 2013-01-18 LAB — CLOSTRIDIUM DIFFICILE BY PCR: Toxigenic C. Difficile by PCR: NEGATIVE

## 2013-01-18 MED ORDER — ACETAMINOPHEN 325 MG PO TABS
650.0000 mg | ORAL_TABLET | Freq: Four times a day (QID) | ORAL | Status: DC | PRN
  Administered 2013-01-18 – 2013-01-21 (×5): 650 mg via ORAL
  Filled 2013-01-18 (×5): qty 2

## 2013-01-18 MED ORDER — ONDANSETRON HCL 4 MG PO TABS
4.0000 mg | ORAL_TABLET | Freq: Four times a day (QID) | ORAL | Status: DC | PRN
  Administered 2013-01-21 – 2013-01-22 (×3): 4 mg via ORAL
  Filled 2013-01-18 (×3): qty 1

## 2013-01-18 MED ORDER — DEXTROSE 5 % IV SOLN
1.0000 g | INTRAVENOUS | Status: DC
  Administered 2013-01-18 – 2013-01-21 (×4): 1 g via INTRAVENOUS
  Filled 2013-01-18 (×4): qty 10

## 2013-01-18 MED ORDER — LEVOTHYROXINE SODIUM 50 MCG PO TABS
50.0000 ug | ORAL_TABLET | Freq: Every day | ORAL | Status: DC
  Administered 2013-01-18 – 2013-01-22 (×5): 50 ug via ORAL
  Filled 2013-01-18 (×6): qty 1

## 2013-01-18 MED ORDER — LISINOPRIL 20 MG PO TABS
20.0000 mg | ORAL_TABLET | Freq: Every day | ORAL | Status: DC
  Administered 2013-01-19 – 2013-01-21 (×3): 20 mg via ORAL
  Filled 2013-01-18 (×5): qty 1

## 2013-01-18 MED ORDER — ASPIRIN 325 MG PO TABS
325.0000 mg | ORAL_TABLET | Freq: Every day | ORAL | Status: DC
  Administered 2013-01-18 – 2013-01-22 (×5): 325 mg via ORAL
  Filled 2013-01-18 (×5): qty 1

## 2013-01-18 MED ORDER — METOPROLOL TARTRATE 25 MG PO TABS
25.0000 mg | ORAL_TABLET | Freq: Two times a day (BID) | ORAL | Status: DC
  Filled 2013-01-18 (×2): qty 1

## 2013-01-18 MED ORDER — ACETAMINOPHEN 650 MG RE SUPP: 650.0000 mg | Freq: Four times a day (QID) | RECTAL | Status: DC | PRN

## 2013-01-18 MED ORDER — SODIUM CHLORIDE 0.9 % IJ SOLN
3.0000 mL | Freq: Two times a day (BID) | INTRAMUSCULAR | Status: DC
  Administered 2013-01-19 – 2013-01-22 (×6): 3 mL via INTRAVENOUS

## 2013-01-18 MED ORDER — LISINOPRIL 20 MG PO TABS
20.0000 mg | ORAL_TABLET | Freq: Every day | ORAL | Status: DC
  Filled 2013-01-18: qty 1

## 2013-01-18 MED ORDER — METOPROLOL TARTRATE 50 MG PO TABS
50.0000 mg | ORAL_TABLET | Freq: Two times a day (BID) | ORAL | Status: DC
  Administered 2013-01-18: 50 mg via ORAL
  Filled 2013-01-18 (×3): qty 1

## 2013-01-18 MED ORDER — ENOXAPARIN SODIUM 30 MG/0.3ML ~~LOC~~ SOLN
30.0000 mg | SUBCUTANEOUS | Status: DC
  Administered 2013-01-18 – 2013-01-22 (×5): 30 mg via SUBCUTANEOUS
  Filled 2013-01-18 (×5): qty 0.3

## 2013-01-18 MED ORDER — METRONIDAZOLE IN NACL 5-0.79 MG/ML-% IV SOLN
500.0000 mg | Freq: Three times a day (TID) | INTRAVENOUS | Status: DC
  Administered 2013-01-18 – 2013-01-22 (×13): 500 mg via INTRAVENOUS
  Filled 2013-01-18 (×14): qty 100

## 2013-01-18 MED ORDER — BIOTENE DRY MOUTH MT LIQD
15.0000 mL | Freq: Two times a day (BID) | OROMUCOSAL | Status: DC
  Administered 2013-01-19 – 2013-01-22 (×7): 15 mL via OROMUCOSAL

## 2013-01-18 MED ORDER — CHLORHEXIDINE GLUCONATE 0.12 % MT SOLN
15.0000 mL | Freq: Two times a day (BID) | OROMUCOSAL | Status: DC
  Administered 2013-01-18 – 2013-01-22 (×8): 15 mL via OROMUCOSAL
  Filled 2013-01-18 (×11): qty 15

## 2013-01-18 MED ORDER — ONDANSETRON HCL 4 MG/2ML IJ SOLN: 4.0000 mg | Freq: Four times a day (QID) | INTRAMUSCULAR | Status: DC | PRN

## 2013-01-18 MED ORDER — SODIUM CHLORIDE 0.9 % IV SOLN
INTRAVENOUS | Status: AC
  Administered 2013-01-18 (×3): via INTRAVENOUS

## 2013-01-18 MED ORDER — BARRIER CREAM NON-SPECIFIED
1.0000 "application " | TOPICAL_CREAM | Freq: Two times a day (BID) | TOPICAL | Status: DC
  Administered 2013-01-18 – 2013-01-21 (×6): 1 via TOPICAL
  Filled 2013-01-18: qty 1

## 2013-01-18 NOTE — Progress Notes (Signed)
Notified Craige Cotta, NP that patient is running SB HR 40-50's on tele. Patient's HR was in 40-50's in the ED. Patient is sleeping and asymptomatic. No new orders given. Will continue to monitor patient. Nelda Marseille, RN

## 2013-01-18 NOTE — ED Notes (Signed)
MD at bedside. 

## 2013-01-18 NOTE — Evaluation (Signed)
Physical Therapy Evaluation Patient Details Name: Cynthia Lynn MRN: [id number] DOB: 1916/10/09 Today's Date: 01/18/2013 Time: 3086-5784 PT Time Calculation (min): 19 min  PT Assessment / Plan / Recommendation Clinical Impression  Pt. is a 79 y/o female admitted for n/v/d, UTI and colitis.  Pt. demonstrated decreased functional mobility during evaluation.    PT Assessment  Patient needs continued PT services    Follow Up Recommendations  Home health PT;Supervision/Assistance - 24 hour    Does the patient have the potential to tolerate intense rehabilitation      Barriers to Discharge Decreased caregiver support Husband available, unable to provide significant physical assistance    Equipment Recommendations  None recommended by PT    Recommendations for Other Services OT consult   Frequency Min 3X/week    Precautions / Restrictions Precautions Precautions: Fall Restrictions Weight Bearing Restrictions: No   Pertinent Vitals/Pain denies      Mobility  Bed Mobility Bed Mobility: Rolling Right;Right Sidelying to Sit;Sitting - Scoot to Edge of Bed Rolling Right: 5: Supervision;With rail Right Sidelying to Sit: 5: Supervision;With rails;HOB elevated (HOB 30 degrees) Sitting - Scoot to Edge of Bed: 5: Supervision;With rail Details for Bed Mobility Assistance: min cues for sequencing and technique Transfers Transfers: Sit to Stand;Stand to Sit;Stand Pivot Transfers Sit to Stand: 3: Mod assist;From bed;With upper extremity assist Stand to Sit: 3: Mod assist;With upper extremity assist;To chair/3-in-1 Stand Pivot Transfers: 1: +2 Total assist Stand Pivot Transfers: Patient Percentage: 60% Details for Transfer Assistance: Pt. utilized PT's UEs to assist with stand pivot transfer Ambulation/Gait Ambulation/Gait Assistance: Not tested (comment) Stairs: No Wheelchair Mobility Wheelchair Mobility: No    Exercises     PT Diagnosis: Difficulty walking;Generalized weakness   PT Problem List: Decreased strength;Decreased activity tolerance;Decreased balance;Decreased mobility;Decreased knowledge of use of DME PT Treatment Interventions: DME instruction;Gait training;Functional mobility training;Therapeutic activities;Therapeutic exercise;Neuromuscular re-education;Balance training   PT Goals Acute Rehab PT Goals PT Goal Formulation: With patient Time For Goal Achievement: 01/25/13 Potential to Achieve Goals: Good Pt will Roll Supine to Right Side: with modified independence PT Goal: Rolling Supine to Right Side - Progress: Goal set today Pt will Roll Supine to Left Side: with modified independence PT Goal: Rolling Supine to Left Side - Progress: Goal set today Pt will go Supine/Side to Sit: with modified independence PT Goal: Supine/Side to Sit - Progress: Goal set today Pt will go Sit to Supine/Side: with modified independence PT Goal: Sit to Supine/Side - Progress: Goal set today Pt will go Sit to Stand: with supervision;with upper extremity assist PT Goal: Sit to Stand - Progress: Goal set today Pt will go Stand to Sit: with supervision;with upper extremity assist PT Goal: Stand to Sit - Progress: Goal set today Pt will Transfer Bed to Chair/Chair to Bed: with supervision PT Transfer Goal: Bed to Chair/Chair to Bed - Progress: Goal set today Pt will Ambulate: 1 - 15 feet;with min assist;with rolling walker PT Goal: Ambulate - Progress: Goal set today  Visit Information  Last PT Received On: 01/18/13 Assistance Needed: +1    Subjective Data  Subjective: "I got the infection." Patient Stated Goal: to return home   Prior Functioning  Home Living Lives With: Spouse Available Help at Discharge: Available 24 hours/day;Family Type of Home: House Home Access: Ramped entrance Home Layout: One level;Full bath on main level;Able to live on main level with bedroom/bathroom Bathroom Shower/Tub: Other (comment) (wash up at sink) Bathroom Toilet:  Standard Bathroom Accessibility: Yes How Accessible: Accessible via walker  Home Adaptive Equipment: Wheelchair - powered;Walker - rolling Prior Function Level of Independence: Needs assistance Needs Assistance: Meal Prep;Light Housekeeping Meal Prep: Maximal Light Housekeeping: Maximal Able to Take Stairs?: No Driving: No Vocation: Retired Musician: HOH Dominant Hand: Right    Cognition  Cognition Overall Cognitive Status: Appears within functional limits for tasks assessed/performed Arousal/Alertness: Awake/alert Orientation Level: Appears intact for tasks assessed Behavior During Session: [facility] for tasks performed    Extremity/Trunk Assessment Right Lower Extremity Assessment RLE ROM/Strength/Tone: Deficits RLE ROM/Strength/Tone Deficits: grossly 3/5 Left Lower Extremity Assessment LLE ROM/Strength/Tone: Deficits LLE ROM/Strength/Tone Deficits: grossly 3/5 Trunk Assessment Trunk Assessment: Kyphotic   Balance Balance Balance Assessed: Yes Static Sitting Balance Static Sitting - Balance Support: Bilateral upper extremity supported;Feet supported Static Sitting - Level of Assistance: 5: Stand by assistance Static Standing Balance Static Standing - Balance Support: Bilateral upper extremity supported;During functional activity (pt. incontinent of stool and stood during pericare) Static Standing - Level of Assistance: 3: Mod assist Static Standing - Comment/# of Minutes: 3  End of Session PT - End of Session Equipment Utilized During Treatment: Gait belt Activity Tolerance: Patient tolerated treatment well Patient left: in chair;with call bell/phone within reach;with nursing in room Nurse Communication: Mobility status  GP     Feltis, Felisha Claytor K 01/18/2013, 11:10 AM  Nicki Reaper. Feltis, PT, DPT [phone]

## 2013-01-18 NOTE — Progress Notes (Signed)
Addendum  Patient seen and examined, chart and data base reviewed.  I agree with the above assessment and plan.  For full details please see Mrs. Algis Downs PA note.   Clint Lipps, MD [facility] Pager: [phone] 01/18/2013, 7:35 PM

## 2013-01-18 NOTE — ED Notes (Signed)
Report called to [phone] nurse.  She asked that stool specimen be sent.

## 2013-01-18 NOTE — ED Notes (Signed)
Called and spoke with pt's daughter.  Informed her that pt was going to be admitted to room 5529.

## 2013-01-18 NOTE — Progress Notes (Signed)
Patient admitted to 5529 from ED. Patient is A&Ox3, legally blind and HOH. Patient lives at home with husband. Patient uses a wheelchair at home. Patient placed on contact precautions to r/o c-diff. Patient's sacrum is slightly red but blanchable. Patient placed on tele. Patient oriented to room and unit. Explained to patient to call for assistance before getting out of bed. Patient stated understanding. Patient placed on bedalarm. Will continue to monitor patient. Nelda Marseille, RN

## 2013-01-18 NOTE — ED Notes (Signed)
Preparing pt for transport.

## 2013-01-18 NOTE — H&P (Addendum)
[facility] History and Physical  Cynthia Lynn:[id number] DOB: 16-Apr-1916 DOA: 01/17/2013  Referring physician: Dr. Effie Shy. PCP: Georgianne Fick, MD  Specialists: None.  Chief Complaint: Weakness and diarrhea.  HPI: Cynthia Lynn is a 79 y.o. female history of hypertension and hypothyroidism has been experiencing some dull right lower quadrant pain for last few days. Last evening patient started having profuse diarrhea with weakness and presented to the ER. Patient denies any nausea vomiting fever chills. In the ER UA was consistent with UTI and CT abdomen and pelvis showed features consistent for colitis and also right-sided ascending UTI. Patient denies having used any recent antibiotics. At this time patient has been admitted for further management. Initial EKG shows mild sinus bradycardia. Patient denies any chest pain shortness of breath loss of consciousness.   Review of Systems: The patient denies anorexia, fever, weight loss,, vision loss, decreased hearing, hoarseness, chest pain, syncope, dyspnea on exertion, peripheral edema, balance deficits, hemoptysis, melena, hematochezia, severe indigestion/heartburn, hematuria, incontinence, genital sores, suspicious skin lesions, transient blindness, difficulty walking, depression, unusual weight change, abnormal bleeding, enlarged lymph nodes, angioedema, and breast masses.  has mild right-sided lower quadrant pain and diarrhea with weakness.  Past Medical History  Diagnosis Date  . Hypertension   . Hypothyroidism   . Legally blind    Past Surgical History  Procedure Laterality Date  . Abdominal hysterectomy    . Tonsillectomy     Social History:  reports that she has never smoked. She does not have any smokeless tobacco history on file. She reports that she does not drink alcohol or use illicit drugs. lives at home with husband. where does patient live--home, ALF, SNF? and with whom if at home?  is legally blind. Can  patient participate in ADLs?  No Known Allergies  Family History  Problem Relation Age of Onset  . CAD Mother   . Liver cancer Sister       Prior to Admission medications   Medication Sig Start Date End Date Taking? Authorizing Provider  aspirin 325 MG tablet Take 325 mg by mouth daily.   Yes Historical Provider, MD  levothyroxine (SYNTHROID, LEVOTHROID) 50 MCG tablet Take 50 mcg by mouth daily.   Yes Historical Provider, MD  lisinopril (PRINIVIL,ZESTRIL) 10 MG tablet Take 2 tablets (20 mg total) by mouth daily. 04/08/12  Yes Reuben Likes, MD  metoprolol (LOPRESSOR) 50 MG tablet Take 50 mg by mouth 2 (two) times daily.   Yes Historical Provider, MD  potassium & sodium phosphates (PHOS-NAK) 280-160-250 MG PACK Take 1 packet by mouth every morning.   Yes Historical Provider, MD   Physical Exam: Filed Vitals:   01/17/13 2013 01/17/13 2015 01/17/13 2100 01/17/13 2145  BP: 128/41 117/52 143/52 163/49  Pulse: 46 49 54 53  Temp:      TempSrc:      Resp: 16 14 17 16   SpO2: 100% 100% 99% 100%     General:   Well-developed and moderately nourished.  Eyes:  anicteric. Patient is legally blind.  ENT:  no discharge from ears eyes nose mouth  Neck:  no mass felt.  Cardiovascular:  S1-S2 heard.  Respiratory:  no rhonchi no crepitations.  Abdomen:  soft nontender bowel sounds present.  Skin:  no rash.  Musculoskeletal:  nontender.  Psychiatric:  appears normal.  Neurologic:  moves all extremities.  Labs on Admission:  Basic Metabolic Panel:  Recent Labs Lab 01/17/13 2035  NA 137  K 4.5  CL 104  CO2 21  GLUCOSE 143*  BUN 42*  CREATININE 1.12*  CALCIUM 9.7   Liver Function Tests: No results found for this basename: AST, ALT, ALKPHOS, BILITOT, PROT, ALBUMIN,  in the last 168 hours No results found for this basename: LIPASE, AMYLASE,  in the last 168 hours No results found for this basename: AMMONIA,  in the last 168 hours CBC:  Recent Labs Lab 01/17/13 2035   WBC 18.3*  NEUTROABS 15.7*  HGB 15.0  HCT 42.0  MCV 91.7  PLT 175   Cardiac Enzymes: No results found for this basename: CKTOTAL, CKMB, CKMBINDEX, TROPONINI,  in the last 168 hours  BNP (last 3 results) No results found for this basename: PROBNP,  in the last 8760 hours CBG: No results found for this basename: GLUCAP,  in the last 168 hours  Radiological Exams on Admission: Ct Abdomen Pelvis W Contrast  01/18/2013  *RADIOLOGY REPORT*  Clinical Data: Diarrhea, abdominal pain  CT ABDOMEN AND PELVIS WITH CONTRAST  Technique:  Multidetector CT imaging of the abdomen and pelvis was performed following the standard protocol during bolus administration of intravenous contrast.  Contrast: 80mL OMNIPAQUE IOHEXOL 300 MG/ML  SOLN  Comparison: None.  Findings: Mild right lung base opacity and trace fluid. Cardiomegaly.  Coarse mitral annular calcification.  Small hiatal hernia.  Unremarkable liver, spleen, pancreas, right adrenal gland.  There is a lateral limb left adrenal nodule measuring 1 cm, indeterminate.  Symmetric renal enhancement.  There is mild urothelial hyperenhancement of the right renal pelvis without hydroureter.  Pelvic floor laxity.  Advanced colonic diverticulosis. Circumferential descending colonic wall thickening with mucosal hyperenhancement.  Limited evaluation of the transverse colon due to decompressed state.  No bowel obstruction.  No free intraperitoneal air or fluid.  No lymphadenopathy.  Advanced atherosclerotic disease of the aorta and branch vessels.  Mild bladder wall thickening is nonspecific given incomplete distension.  Absent uterus.  No adnexal mass.  Osteopenia and multilevel degenerative change.  No acute osseous finding.  IMPRESSION: Wall thickening and mucosal hyperenhancement of the descending colon.  Differential includes ischemic, infectious, and inflammatory colitis.  Mild bladder wall thickening and urothelial hyperenhancement of the right renal pelvis.  May  reflect an ascending infection.  Correlate with urinalysis.  Mild right lung base opacity; scarring versus infiltrate. Trace associated pleural thickening versus fluid.  Small hiatal hernia.  Coarse mitral annular calcification and mild cardiomegaly.  Advanced atherosclerotic disease of the aorta and branch vessels.   Original Report Authenticated By: Jearld Lesch, M.D.    Dg Chest Port 1 View  01/17/2013  *RADIOLOGY REPORT*  Clinical Data: Weakness and diarrhea.  PORTABLE CHEST - 1 VIEW  Comparison: One-view chest 10/01/2007.  Findings: The heart size is normal.  Emphysematous changes again noted.  No focal airspace disease is evident.  The aorta is tortuous. Advanced degenerative changes are noted in the shoulders, worse on the left.  Biapical pleural parenchymal scarring is stable.  IMPRESSION:  1.  No acute cardiopulmonary disease. 2.  Emphysema.   Original Report Authenticated By: Marin Roberts, M.D.     EKG: Independently reviewed.  sinus bradycardia. Presently monitor shows heart rate of 70 beats per minute.  Assessment/Plan Principal Problem:   Colitis Active Problems:   Dehydration   UTI (lower urinary tract infection)   HTN (hypertension)   Hypothyroidism   Leucocytosis   1. Colitis - probably infectious. Check stool for C. difficile and culture. Check lactic acid levels. At this time I have placed patient  on Flagyl. May consider GI consult to rule out other causes if C. difficile is negative. Patient does not look toxic at this time. 2. UTI with possible pyelonephritis - follow urine culture. Continue ceftriaxone. 3. Weakness probably from dehydration - continue gentle hydration. Patient did have mild sinus bradycardia on arrival closely follow in telemetry. 4. Hypertension - presently continue present medication. 5. Hypothyroidism - check TSH. Continue Synthroid. 6. Leukocytosis probably from infectious process follow CBC. 7. Legally blind.   No consult requested. if  consultant consulted, please document name and whether formally or informally consulted  Code Status:  full code.  Family Communication:  no family at bedside.  Disposition Plan:  admit to inpatient.   KAKRAKANDY,ARSHAD N. [facility] Pager [phone].  If 7PM-7AM, please contact night-coverage [email or web address] Password [email or web address] 01/18/2013, 1:15 AM

## 2013-01-18 NOTE — Progress Notes (Signed)
[facility] PROGRESS NOTE  Cynthia Lynn NWG:[id number] DOB: 10-09-1916 DOA: 01/17/2013 PCP: Georgianne Fick, MD   Cynthia Lynn is a 79 y.o. female history of hypertension and hypothyroidism has been experiencing some dull right lower quadrant pain for last few days. Last evening patient started having profuse diarrhea with weakness and presented to the ER. Patient denies any nausea vomiting fever chills. In the ER UA was consistent with UTI and CT abdomen and pelvis showed features consistent for colitis and also right-sided ascending UTI. Patient denies having used any recent antibiotics.  She reports she is wheel chair bound due to weakness in her legs and falls.  She is also legally blind.  Assessment/Plan:  Diarrhea Patient reports liquid stools.  Decreasing over night C-diff negative.  On flagyl  UTI On Rocephin.  Started at admission Culture pending  Sinus brady - no history of SB noted in chart Stable.  Will check Orthostatic vitals. Metoprolol being held for pulse less than 60.  Hypertension / hypotension On Metoprolol and Lisinopril prior to admission These are being held at this point due to low/normal BP.  Hypothyroidism / Hyperthyroidism TSH low - could contribute to diarrhea Will not adjust synthroid at this point, but rather will defer for outpatient follow up.  Dehydration Secondary to diarrhea Hgb baseline 15, but has dropped from 15 to 12 with IV hydration. Will decrease IVF.  Code Status: full code Family Communication:  Disposition Plan: PT eval.  Hopefully home with home health.  Antibiotics:  Flagyl and rocephin started at admission.  HPI/Subjective: Describes some soreness of her stomach but no pain.    Objective: Filed Vitals:   01/17/13 2145 01/18/13 0154 01/18/13 0214 01/18/13 0553  BP: 163/49  129/73 103/52  Pulse: 53  62 53  Temp:  98.6 F (37 C) 98 F (36.7 C) 98.5 F (36.9 C)  TempSrc:  Oral Oral Oral  Resp: 16  20 20    Height:   5\' 4"  (1.626 m)   Weight:   62.4 kg (137 lb 9.1 oz)   SpO2: 100%  98% 97%    Intake/Output Summary (Last 24 hours) at 01/18/13 0913 Last data filed at 01/18/13 5784  Gross per 24 hour  Intake 486.67 ml  Output    300 ml  Net 186.67 ml   Filed Weights   01/18/13 0214  Weight: 62.4 kg (137 lb 9.1 oz)    Exam:   General:  WD, WN elderly female lying comfortably in bed.  (Legally Blind)  Cardiovascular: RRR with systolic murmur, no lower extremity edema  Respiratory: CTA, no W/R/C, no accessory muscle movement  Abdomen: soft, nt, nd, active bowel sounds, no scars.  extremities:  No edema, able to move all 4.    Data Reviewed: Basic Metabolic Panel:  Recent Labs Lab 01/17/13 2035 01/18/13 0532  NA 137 137  K 4.5 4.4  CL 104 106  CO2 21 21  GLUCOSE 143* 108*  BUN 42* 41*  CREATININE 1.12* 1.15*  CALCIUM 9.7 8.4  PHOS  --  4.0   Liver Function Tests:  Recent Labs Lab 01/18/13 0532  AST 20  ALT 10  ALKPHOS 58  BILITOT 0.3  PROT 5.5*  ALBUMIN 2.7*   CBC:  Recent Labs Lab 01/17/13 2035 01/18/13 0532  WBC 18.3* 14.3*  NEUTROABS 15.7* 11.3*  HGB 15.0 12.0  HCT 42.0 33.8*  MCV 91.7 90.4  PLT 175 177     Studies: Ct Abdomen Pelvis W Contrast  01/18/2013  *  RADIOLOGY REPORT*  Clinical Data: Diarrhea, abdominal pain  CT ABDOMEN AND PELVIS WITH CONTRAST  Technique:  Multidetector CT imaging of the abdomen and pelvis was performed following the standard protocol during bolus administration of intravenous contrast.  Contrast: 80mL OMNIPAQUE IOHEXOL 300 MG/ML  SOLN  Comparison: None.  Findings: Mild right lung base opacity and trace fluid. Cardiomegaly.  Coarse mitral annular calcification.  Small hiatal hernia.  Unremarkable liver, spleen, pancreas, right adrenal gland.  There is a lateral limb left adrenal nodule measuring 1 cm, indeterminate.  Symmetric renal enhancement.  There is mild urothelial hyperenhancement of the right renal pelvis without  hydroureter.  Pelvic floor laxity.  Advanced colonic diverticulosis. Circumferential descending colonic wall thickening with mucosal hyperenhancement.  Limited evaluation of the transverse colon due to decompressed state.  No bowel obstruction.  No free intraperitoneal air or fluid.  No lymphadenopathy.  Advanced atherosclerotic disease of the aorta and branch vessels.  Mild bladder wall thickening is nonspecific given incomplete distension.  Absent uterus.  No adnexal mass.  Osteopenia and multilevel degenerative change.  No acute osseous finding.  IMPRESSION: Wall thickening and mucosal hyperenhancement of the descending colon.  Differential includes ischemic, infectious, and inflammatory colitis.  Mild bladder wall thickening and urothelial hyperenhancement of the right renal pelvis.  May reflect an ascending infection.  Correlate with urinalysis.  Mild right lung base opacity; scarring versus infiltrate. Trace associated pleural thickening versus fluid.  Small hiatal hernia.  Coarse mitral annular calcification and mild cardiomegaly.  Advanced atherosclerotic disease of the aorta and branch vessels.   Original Report Authenticated By: Jearld Lesch, M.D.    Dg Chest Port 1 View  01/17/2013  *RADIOLOGY REPORT*  Clinical Data: Weakness and diarrhea.  PORTABLE CHEST - 1 VIEW  Comparison: One-view chest 10/01/2007.  Findings: The heart size is normal.  Emphysematous changes again noted.  No focal airspace disease is evident.  The aorta is tortuous. Advanced degenerative changes are noted in the shoulders, worse on the left.  Biapical pleural parenchymal scarring is stable.  IMPRESSION:  1.  No acute cardiopulmonary disease. 2.  Emphysema.   Original Report Authenticated By: Marin Roberts, M.D.     Scheduled Meds: . antiseptic oral rinse  15 mL Mouth Rinse q12n4p  . aspirin  325 mg Oral Daily  . barrier cream  1 application Topical BID  . cefTRIAXone (ROCEPHIN)  IV  1 g Intravenous Q24H  .  chlorhexidine  15 mL Mouth Rinse BID  . enoxaparin (LOVENOX) injection  30 mg Subcutaneous Q24H  . levothyroxine  50 mcg Oral QAC breakfast  . lisinopril  20 mg Oral Daily  . metoprolol  25 mg Oral BID  . metronidazole  500 mg Intravenous Q8H  . sodium chloride  3 mL Intravenous Q12H   Continuous Infusions: . sodium chloride 100 mL/hr at 01/18/13 0219    Principal Problem:   Colitis Active Problems:   Dehydration   UTI (lower urinary tract infection)   HTN (hypertension)   Hypothyroidism   Leucocytosis    Time spent: 30 min    Stephani Police  [facility] Pager [phone]. If 8PM-8AM, please contact night-coverage at [email or web address], password [facility] 01/18/2013, 9:13 AM  LOS: 1 day

## 2013-01-19 LAB — CBC
Hemoglobin: 11.7 g/dL — ABNORMAL LOW (ref 12.0–15.0)
MCH: 32.1 pg (ref 26.0–34.0)
MCV: 91.2 fL (ref 78.0–100.0)
RBC: 3.64 MIL/uL — ABNORMAL LOW (ref 3.87–5.11)

## 2013-01-19 LAB — URINE CULTURE

## 2013-01-19 LAB — BASIC METABOLIC PANEL
CO2: 20 mEq/L (ref 19–32)
Glucose, Bld: 93 mg/dL (ref 70–99)
Potassium: 4 mEq/L (ref 3.5–5.1)
Sodium: 141 mEq/L (ref 135–145)

## 2013-01-19 MED ORDER — HYDRALAZINE HCL 20 MG/ML IJ SOLN
2.0000 mg | Freq: Four times a day (QID) | INTRAMUSCULAR | Status: DC | PRN
  Administered 2013-01-20 (×3): 2 mg via INTRAVENOUS
  Filled 2013-01-19 (×3): qty 0.1

## 2013-01-19 NOTE — Clinical Documentation Improvement (Signed)
RENAL FAILURE DOCUMENTATION CLARIFICATION QUERY  THIS DOCUMENT IS NOT A PERMANENT PART OF THE MEDICAL RECORD  TO RESPOND TO THE THIS QUERY, FOLLOW THE INSTRUCTIONS BELOW:  1. If needed, update documentation for the patient's encounter via the notes activity.  2. Access this query again and click edit on the In [employer].  3. After updating, or not, click F2 to complete all highlighted (required) fields concerning your review. Select "additional documentation in the medical record" OR "no additional documentation provided".  4. Click Sign note button.  5. The deficiency will fall out of your In Basket *Please let us know if you are not able to complete this workflow by phone or e-mail (listed below).  Please update your documentation within the medical record to reflect your response to this query.                                                                                    01/19/13  Dear Algis Downs, PA   In a better effort to capture your patient's severity of illness, reflect appropriate length of stay and utilization of resources, a review of the patient medical record has revealed the following indicators.    Based on your clinical judgment, please clarify and document in a progress note and/or discharge summary the clinical condition associated with the following supporting information:  In responding to this query please exercise your independent judgment.  The fact that a query is asked, does not imply that any particular answer is desired or expected.   Noted patient's admission BUN/Crt levels elevated , if appropriate please provide documentation for abnormal lab values.  Thank you    Possible Clinical Conditions?   Acute Renal Failure  Acute Kidney injury  Other Condition  Cannot Clinically Determine       Risk Factors: age, dehydration, profuse diarrhea   Signs and Symptoms:  Weakness,  Urine outpt 337ml/ in 12 hours 2/19-2/20 with IVF @ 125 ml/hr    Baseline Cr Level : 1.04 (2/21  Current Creatinine Level (trend):  1.15 (2/20, 1.12(2/19, 0.90 (04/08/12   Baseline BUN Level :     28 ( 2/21   Current BUN Level (trend): 41 ( 2/20, 42 (2/19, 16 (04/08/12   GFR:  45 ( 2/21 ,  39( 2/20, 41 (2/19,    Treatments: NS 125 ml, fluids currently @ 75 ml/hr                    You may use possible, probable, or suspect with inpatient documentation. possible, probable, suspected diagnoses MUST be documented at the time of discharge  Reviewed: Acute renal failure  Noted in d/c summary   Thank You,  Lavonda Jumbo  Clinical Documentation Specialist, BSN: Pager [phone]  Health Information Management [place]

## 2013-01-19 NOTE — Progress Notes (Signed)
Physical Therapy Treatment Patient Details Name: Cynthia Lynn MRN: [id number] DOB: 1916-03-18 Today's Date: 01/19/2013 Time: 1191-4782 PT Time Calculation (min): 15 min  PT Assessment / Plan / Recommendation Comments on Treatment Session  Pt with impoved mobility today. Just needed supervision and no physical assist. At baseline pt primarily uses power w/c and only takes a few steps into bathroom.  Pt could benefit from [facility] at home.    Follow Up Recommendations  Home health PT;Supervision/Assistance - 24 hour     Does the patient have the potential to tolerate intense rehabilitation     Barriers to Discharge        Equipment Recommendations  Other (comment) [facility])    Recommendations for Other Services    Frequency Min 3X/week   Plan Discharge plan remains appropriate;Frequency remains appropriate    Precautions / Restrictions Precautions Precautions: Fall   Pertinent Vitals/Pain No c/o's of pain.    Mobility  Bed Mobility Rolling Right: 6: Modified independent (Device/Increase time);With rail Right Sidelying to Sit: 6: Modified independent (Device/Increase time);HOB elevated;With rails Sitting - Scoot to Edge of Bed: 6: Modified independent (Device/Increase time) Details for Bed Mobility Assistance: No cues needed Transfers Sit to Stand: 5: Supervision;With upper extremity assist;From bed;From chair/3-in-1;With armrests Stand to Sit: 5: Supervision;With upper extremity assist;With armrests;To chair/3-in-1 Stand Pivot Transfers: 5: Supervision;With armrests Details for Transfer Assistance: Used rolling walker for stand pivot transfer Ambulation/Gait Ambulation/Gait Assistance: 4: Min guard Ambulation Distance (Feet): 12 Feet Assistive device: Rolling walker Ambulation/Gait Assistance Details: Pt only walks very short distances in house. Gait Pattern: Trunk flexed;Step-through pattern;Decreased stride length    Exercises     PT Diagnosis:    PT Problem List:   PT  Treatment Interventions:     PT Goals Acute Rehab PT Goals PT Goal: Rolling Supine to Right Side - Progress: Met PT Goal: Supine/Side to Sit - Progress: Met Pt will go Sit to Stand: with modified independence PT Goal: Sit to Stand - Progress: Updated due to goal met Pt will go Stand to Sit: with modified independence PT Goal: Stand to Sit - Progress: Updated due to goals met Pt will Transfer Bed to Chair/Chair to Bed: with modified independence PT Transfer Goal: Bed to Chair/Chair to Bed - Progress: Updated due to goal met Pt will Ambulate: 16 - 50 feet;with rolling walker;with supervision PT Goal: Ambulate - Progress: Updated due to goal met  Visit Information  Last PT Received On: 01/19/13 Assistance Needed: +1    Subjective Data  Subjective: "He won't have to help, I can do it," pt stated about husband.   Cognition  Cognition Overall Cognitive Status: Appears within functional limits for tasks assessed/performed Arousal/Alertness: Awake/alert Orientation Level: Appears intact for tasks assessed Behavior During Session: [facility] for tasks performed    Balance  Static Standing Balance Static Standing - Balance Support: Left upper extremity supported;During functional activity Static Standing - Level of Assistance: 5: Stand by assistance  End of Session PT - End of Session Equipment Utilized During Treatment: Gait belt Activity Tolerance: Patient tolerated treatment well Patient left: in chair;with call bell/phone within reach;with nursing in room Nurse Communication: Mobility status   GP     [name] 01/19/2013, 11:05 AM  Skip Mayer PT [phone]

## 2013-01-19 NOTE — Progress Notes (Signed)
Renato Gails and wife stopped by and are concerned with patient's situation when she goes home.  She would like for CM to notify the [facility] that the patient is blind and that she has family nearby (but they do not check on her frequently).  They are concerned with her care at home and would just like for someone to be aware.

## 2013-01-19 NOTE — Progress Notes (Signed)
[facility] PROGRESS NOTE  CORNELIUS SCHUITEMA AOZ:[id number] DOB: [birth year] DOA: 01/17/2013 PCP: Georgianne Fick, MD  HPI/Subjective: List area, feels better   Cynthia Lynn is a 79 y.o. female history of hypertension and hypothyroidism has been experiencing some dull right lower quadrant pain for last few days. Last evening patient started having profuse diarrhea with weakness and presented to the ER. Patient denies any nausea vomiting fever chills. In the ER UA was consistent with UTI and CT abdomen and pelvis showed features consistent for colitis and also right-sided ascending UTI. Patient denies having used any recent antibiotics.  She reports she is wheel chair bound due to weakness in her legs and falls.  She is also legally blind.  Assessment/Plan:  Diarrhea Patient reports liquid stools.  Decreasing over night C-diff negative.  On flagyl  UTI On Rocephin.  Started at admission Culture pending  Sinus brady - no history of SB noted in chart Stable.  Will check Orthostatic vitals. Metoprolol being held for pulse less than 60.  Hypertension / hypotension On Metoprolol and Lisinopril prior to admission These are being held at this point due to low/normal BP.  Hypothyroidism / Hyperthyroidism TSH low - could contribute to diarrhea Will not adjust synthroid at this point, but rather will defer for outpatient follow up.  Dehydration Secondary to diarrhea Hgb baseline 15, but has dropped from 15 to 12 with IV hydration. Will decrease IVF.  Code Status: full code Family Communication:  Disposition Plan: PT eval.  Hopefully home with home health.  Antibiotics:  Flagyl and rocephin started at admission.    Objective: Filed Vitals:   01/18/13 1619 01/18/13 2100 01/19/13 0511 01/19/13 1349  BP: 113/64 129/71 139/52 130/47  Pulse: 67 66 61 61  Temp:  98.8 F (37.1 C) 97.7 F (36.5 C) 98.6 F (37 C)  TempSrc:  Oral Oral Oral  Resp:  16 18 18   Height:       Weight:      SpO2:  97% 94% 97%    Intake/Output Summary (Last 24 hours) at 01/19/13 1528 Last data filed at 01/19/13 1300  Gross per 24 hour  Intake 2335.42 ml  Output    650 ml  Net 1685.42 ml   Filed Weights   01/18/13 0214  Weight: 62.4 kg (137 lb 9.1 oz)    Exam:   General:  WD, WN elderly female lying comfortably in bed.  (Legally Blind)  Cardiovascular: RRR with systolic murmur, no lower extremity edema  Respiratory: CTA, no W/R/C, no accessory muscle movement  Abdomen: soft, nt, nd, active bowel sounds, no scars.  extremities:  No edema, able to move all 4.    Data Reviewed: Basic Metabolic Panel:  Recent Labs Lab 01/17/13 2035 01/18/13 0532 01/19/13 0538  NA 137 137 141  K 4.5 4.4 4.0  CL 104 106 112  CO2 21 21 20   GLUCOSE 143* 108* 93  BUN 42* 41* 28*  CREATININE 1.12* 1.15* 1.04  CALCIUM 9.7 8.4 8.4  PHOS  --  4.0  --    Liver Function Tests:  Recent Labs Lab 01/18/13 0532  AST 20  ALT 10  ALKPHOS 58  BILITOT 0.3  PROT 5.5*  ALBUMIN 2.7*   CBC:  Recent Labs Lab 01/17/13 2035 01/18/13 0532 01/19/13 0538  WBC 18.3* 14.3* 9.2  NEUTROABS 15.7* 11.3*  --   HGB 15.0 12.0 11.7*  HCT 42.0 33.8* 33.2*  MCV 91.7 90.4 91.2  PLT 175 177 151  Studies: Ct Abdomen Pelvis W Contrast  01/18/2013  *RADIOLOGY REPORT*  Clinical Data: Diarrhea, abdominal pain  CT ABDOMEN AND PELVIS WITH CONTRAST  Technique:  Multidetector CT imaging of the abdomen and pelvis was performed following the standard protocol during bolus administration of intravenous contrast.  Contrast: 80mL OMNIPAQUE IOHEXOL 300 MG/ML  SOLN  Comparison: None.  Findings: Mild right lung base opacity and trace fluid. Cardiomegaly.  Coarse mitral annular calcification.  Small hiatal hernia.  Unremarkable liver, spleen, pancreas, right adrenal gland.  There is a lateral limb left adrenal nodule measuring 1 cm, indeterminate.  Symmetric renal enhancement.  There is mild urothelial  hyperenhancement of the right renal pelvis without hydroureter.  Pelvic floor laxity.  Advanced colonic diverticulosis. Circumferential descending colonic wall thickening with mucosal hyperenhancement.  Limited evaluation of the transverse colon due to decompressed state.  No bowel obstruction.  No free intraperitoneal air or fluid.  No lymphadenopathy.  Advanced atherosclerotic disease of the aorta and branch vessels.  Mild bladder wall thickening is nonspecific given incomplete distension.  Absent uterus.  No adnexal mass.  Osteopenia and multilevel degenerative change.  No acute osseous finding.  IMPRESSION: Wall thickening and mucosal hyperenhancement of the descending colon.  Differential includes ischemic, infectious, and inflammatory colitis.  Mild bladder wall thickening and urothelial hyperenhancement of the right renal pelvis.  May reflect an ascending infection.  Correlate with urinalysis.  Mild right lung base opacity; scarring versus infiltrate. Trace associated pleural thickening versus fluid.  Small hiatal hernia.  Coarse mitral annular calcification and mild cardiomegaly.  Advanced atherosclerotic disease of the aorta and branch vessels.   Original Report Authenticated By: Jearld Lesch, M.D.    Dg Chest Port 1 View  01/17/2013  *RADIOLOGY REPORT*  Clinical Data: Weakness and diarrhea.  PORTABLE CHEST - 1 VIEW  Comparison: One-view chest 10/01/2007.  Findings: The heart size is normal.  Emphysematous changes again noted.  No focal airspace disease is evident.  The aorta is tortuous. Advanced degenerative changes are noted in the shoulders, worse on the left.  Biapical pleural parenchymal scarring is stable.  IMPRESSION:  1.  No acute cardiopulmonary disease. 2.  Emphysema.   Original Report Authenticated By: Marin Roberts, M.D.     Scheduled Meds: . antiseptic oral rinse  15 mL Mouth Rinse q12n4p  . aspirin  325 mg Oral Daily  . barrier cream  1 application Topical BID  .  cefTRIAXone (ROCEPHIN)  IV  1 g Intravenous Q24H  . chlorhexidine  15 mL Mouth Rinse BID  . enoxaparin (LOVENOX) injection  30 mg Subcutaneous Q24H  . levothyroxine  50 mcg Oral QAC breakfast  . lisinopril  20 mg Oral Daily  . metronidazole  500 mg Intravenous Q8H  . sodium chloride  3 mL Intravenous Q12H   Continuous Infusions:    Principal Problem:   Colitis Active Problems:   Dehydration   UTI (lower urinary tract infection)   HTN (hypertension)   Hypothyroidism   Leucocytosis    Time spent: 30 min    [facility] A  [facility] Pager [phone]. If 8PM-8AM, please contact night-coverage at [email or web address], password [facility] 01/19/2013, 3:28 PM  LOS: 2 days

## 2013-01-20 NOTE — Progress Notes (Signed)
[facility] PROGRESS NOTE  Cynthia Lynn NWG:[id number] DOB: Oct 13, 1916 DOA: 01/17/2013 PCP: Cynthia Fick, MD  HPI/Subjective: Feels nauseous this morning, hitting it and did not feel right.   Cynthia Lynn is a 79 y.o. female history of hypertension and hypothyroidism has been experiencing some dull right lower quadrant pain for last few days. Last evening patient started having profuse diarrhea with weakness and presented to the ER. Patient denies any nausea vomiting fever chills. In the ER UA was consistent with UTI and CT abdomen and pelvis showed features consistent for colitis and also right-sided ascending UTI. Patient denies having used any recent antibiotics.  She reports she is wheel chair bound due to weakness in her legs and falls.  She is also legally blind.  Assessment/Plan:  Colitis Patient reports liquid stools.  Decreasing over night C-diff negative.  On flagyl, differential diagnoses including ischemic and infectious colitis.  UTI On Rocephin.  Started at admission Escherichia coli, susceptible to ciprofloxacin. She is on Rocephin currently  Sinus brady - no history of SB noted in chart Stable.  Will check Orthostatic vitals. Metoprolol being held for pulse less than 60.  Hypertension / hypotension On Metoprolol and Lisinopril prior to admission These are being held at this point due to low/normal BP.  Hypothyroidism / Hyperthyroidism TSH low - could contribute to diarrhea Will not adjust synthroid at this point, but rather will defer for outpatient follow up.  Dehydration Secondary to diarrhea Hgb baseline 15, but has dropped from 15 to 12 with IV hydration. Will decrease IVF.  Code Status: full code Family Communication:  Disposition Plan: PT eval.  Hopefully home with home health.  Antibiotics:  Flagyl and rocephin started at admission.    Objective: Filed Vitals:   01/20/13 0000 01/20/13 0502 01/20/13 0736 01/20/13 0815  BP: 171/81  168/70 171/91 131/70  Pulse:  67 93 89  Temp:  97.8 F (36.6 C) 98.2 F (36.8 C) 97.5 F (36.4 C)  TempSrc:  Oral Oral Oral  Resp:  18 18 18   Height:      Weight:      SpO2:  98% 97% 96%    Intake/Output Summary (Last 24 hours) at 01/20/13 1206 Last data filed at 01/20/13 0915  Gross per 24 hour  Intake    720 ml  Output    400 ml  Net    320 ml   Filed Weights   01/18/13 0214  Weight: 62.4 kg (137 lb 9.1 oz)    Exam:   General:  WD, WN elderly female lying comfortably in bed.  (Legally Blind)  Cardiovascular: RRR with systolic murmur, no lower extremity edema  Respiratory: CTA, no W/R/C, no accessory muscle movement  Abdomen: soft, nt, nd, active bowel sounds, no scars.  extremities:  No edema, able to move all 4.    Data Reviewed: Basic Metabolic Panel:  Recent Labs Lab 01/17/13 2035 01/18/13 0532 01/19/13 0538  NA 137 137 141  K 4.5 4.4 4.0  CL 104 106 112  CO2 21 21 20   GLUCOSE 143* 108* 93  BUN 42* 41* 28*  CREATININE 1.12* 1.15* 1.04  CALCIUM 9.7 8.4 8.4  PHOS  --  4.0  --    Liver Function Tests:  Recent Labs Lab 01/18/13 0532  AST 20  ALT 10  ALKPHOS 58  BILITOT 0.3  PROT 5.5*  ALBUMIN 2.7*   CBC:  Recent Labs Lab 01/17/13 2035 01/18/13 0532 01/19/13 0538  WBC 18.3* 14.3* 9.2  NEUTROABS  15.7* 11.3*  --   HGB 15.0 12.0 11.7*  HCT 42.0 33.8* 33.2*  MCV 91.7 90.4 91.2  PLT 175 177 151     Studies: No results found.  Scheduled Meds: . antiseptic oral rinse  15 mL Mouth Rinse q12n4p  . aspirin  325 mg Oral Daily  . barrier cream  1 application Topical BID  . cefTRIAXone (ROCEPHIN)  IV  1 g Intravenous Q24H  . chlorhexidine  15 mL Mouth Rinse BID  . enoxaparin (LOVENOX) injection  30 mg Subcutaneous Q24H  . levothyroxine  50 mcg Oral QAC breakfast  . lisinopril  20 mg Oral Daily  . metronidazole  500 mg Intravenous Q8H  . sodium chloride  3 mL Intravenous Q12H   Continuous Infusions:    Principal Problem:    Colitis Active Problems:   Dehydration   UTI (lower urinary tract infection)   HTN (hypertension)   Hypothyroidism   Leucocytosis    Time spent: 30 min    [name] A  [facility] Pager [phone]. If 8PM-8AM, please contact night-coverage at [email or web address], password [facility] 01/20/2013, 12:06 PM  LOS: 3 days

## 2013-01-21 LAB — BASIC METABOLIC PANEL
BUN: 20 mg/dL (ref 6–23)
GFR calc Af Amer: 57 mL/min — ABNORMAL LOW (ref >90)
GFR calc non Af Amer: 49 mL/min — ABNORMAL LOW (ref >90)
Potassium: 3.8 mEq/L (ref 3.5–5.1)

## 2013-01-21 MED ORDER — ALPRAZOLAM 0.25 MG PO TABS: 0.2500 mg | ORAL_TABLET | Freq: Once | ORAL | Status: DC

## 2013-01-21 MED ORDER — POTASSIUM CHLORIDE CRYS ER 20 MEQ PO TBCR
40.0000 meq | EXTENDED_RELEASE_TABLET | Freq: Once | ORAL | Status: AC
  Administered 2013-01-21: 40 meq via ORAL
  Filled 2013-01-21: qty 2

## 2013-01-21 NOTE — Progress Notes (Signed)
[facility] PROGRESS NOTE  XAN SPARKMAN ZOX:[id number] DOB: 24-Feb-1916 DOA: 01/17/2013 PCP: Georgianne Fick, MD  HPI/Subjective: Feels better than yesterday, wants to go to rehabilitation.   Cynthia Lynn is a 79 y.o. female history of hypertension and hypothyroidism has been experiencing some dull right lower quadrant pain for last few days. Last evening patient started having profuse diarrhea with weakness and presented to the ER. Patient denies any nausea vomiting fever chills. In the ER UA was consistent with UTI and CT abdomen and pelvis showed features consistent for colitis and also right-sided ascending UTI. Patient denies having used any recent antibiotics.  She reports she is wheel chair bound due to weakness in her legs and falls.  She is also legally blind.  Assessment/Plan:  Colitis Patient reports liquid stools.  No diarrhea since yesterday. C-diff negative.  On flagyl, differential diagnoses including ischemic and infectious colitis.  UTI On Rocephin.  Started at admission Escherichia coli, susceptible to ciprofloxacin. She is on Rocephin currently. Can be likely discharged on oral Cipro and Flagyl.  Acute renal failure Mild acute renal sufficiency secondary to dehydration. BUN/CR ratio is more than 20:1. This is resolved after gentle IV fluid hydration.  Sinus brady - no history of SB noted in chart Stable.  Will check Orthostatic vitals. Metoprolol being held for pulse less than 60.  Hypertension / hypotension On Metoprolol and Lisinopril prior to admission These are being held at this point due to low/normal BP.  Hypothyroidism / Hyperthyroidism TSH low - could contribute to diarrhea Will not adjust synthroid at this point, but rather will defer for outpatient follow up.  Dehydration Secondary to diarrhea Hgb baseline 15, but has dropped from 15 to 12 with IV hydration.   Code Status: full code Family Communication:  Disposition Plan: PT eval.   Hopefully home with home health.  Antibiotics:  Flagyl and rocephin started at admission.    Objective: Filed Vitals:   01/20/13 0815 01/20/13 1430 01/20/13 2230 01/21/13 0528  BP: 131/70 147/63 181/71 122/67  Pulse: 89 72 73 83  Temp: 97.5 F (36.4 C) 98.7 F (37.1 C) 98.5 F (36.9 C) 98.7 F (37.1 C)  TempSrc: Oral Oral Oral Oral  Resp: 18 18 20 16   Height:      Weight:      SpO2: 96% 99% 99% 95%    Intake/Output Summary (Last 24 hours) at 01/21/13 1132 Last data filed at 01/21/13 0500  Gross per 24 hour  Intake    120 ml  Output    400 ml  Net   -280 ml   Filed Weights   01/18/13 0214  Weight: 62.4 kg (137 lb 9.1 oz)    Exam:   General:  WD, WN elderly female lying comfortably in bed.  (Legally Blind)  Cardiovascular: RRR with systolic murmur, no lower extremity edema  Respiratory: CTA, no W/R/C, no accessory muscle movement  Abdomen: soft, nt, nd, active bowel sounds, no scars.  extremities:  No edema, able to move all 4.    Data Reviewed: Basic Metabolic Panel:  Recent Labs Lab 01/17/13 2035 01/18/13 0532 01/19/13 0538 01/21/13 0614  NA 137 137 141 140  K 4.5 4.4 4.0 3.8  CL 104 106 112 108  CO2 21 21 20 24   GLUCOSE 143* 108* 93 121*  BUN 42* 41* 28* 20  CREATININE 1.12* 1.15* 1.04 0.96  CALCIUM 9.7 8.4 8.4 8.6  PHOS  --  4.0  --   --    Liver  Function Tests:  Recent Labs Lab 01/18/13 0532  AST 20  ALT 10  ALKPHOS 58  BILITOT 0.3  PROT 5.5*  ALBUMIN 2.7*   CBC:  Recent Labs Lab 01/17/13 2035 01/18/13 0532 01/19/13 0538  WBC 18.3* 14.3* 9.2  NEUTROABS 15.7* 11.3*  --   HGB 15.0 12.0 11.7*  HCT 42.0 33.8* 33.2*  MCV 91.7 90.4 91.2  PLT 175 177 151     Studies: No results found.  Scheduled Meds: . ALPRAZolam  0.25 mg Oral Once  . antiseptic oral rinse  15 mL Mouth Rinse q12n4p  . aspirin  325 mg Oral Daily  . barrier cream  1 application Topical BID  . cefTRIAXone (ROCEPHIN)  IV  1 g Intravenous Q24H  .  chlorhexidine  15 mL Mouth Rinse BID  . enoxaparin (LOVENOX) injection  30 mg Subcutaneous Q24H  . levothyroxine  50 mcg Oral QAC breakfast  . lisinopril  20 mg Oral Daily  . metronidazole  500 mg Intravenous Q8H  . sodium chloride  3 mL Intravenous Q12H   Continuous Infusions:    Principal Problem:   Colitis Active Problems:   Dehydration   UTI (lower urinary tract infection)   HTN (hypertension)   Hypothyroidism   Leucocytosis    Time spent: 30 min    [facility] A  [facility] Pager [phone]. If 8PM-8AM, please contact night-coverage at [email or web address], password [facility] 01/21/2013, 11:32 AM  LOS: 4 days

## 2013-01-22 LAB — BASIC METABOLIC PANEL
Calcium: 8.4 mg/dL (ref 8.4–10.5)
Chloride: 109 mEq/L (ref 96–112)
Creatinine, Ser: 1.08 mg/dL (ref 0.50–1.10)
GFR calc Af Amer: 49 mL/min — ABNORMAL LOW (ref >90)

## 2013-01-22 MED ORDER — METRONIDAZOLE 500 MG PO TABS
500.0000 mg | ORAL_TABLET | Freq: Three times a day (TID) | ORAL | Status: DC
  Administered 2013-01-22 (×2): 500 mg via ORAL
  Filled 2013-01-22 (×4): qty 1

## 2013-01-22 MED ORDER — METRONIDAZOLE 500 MG PO TABS: 500.0000 mg | ORAL_TABLET | Freq: Three times a day (TID) | ORAL | Status: DC

## 2013-01-22 MED ORDER — CIPROFLOXACIN HCL 500 MG PO TABS
500.0000 mg | ORAL_TABLET | Freq: Two times a day (BID) | ORAL | Status: DC
  Administered 2013-01-22: 500 mg via ORAL
  Filled 2013-01-22 (×3): qty 1

## 2013-01-22 MED ORDER — CIPROFLOXACIN HCL 500 MG PO TABS: 500.0000 mg | ORAL_TABLET | Freq: Two times a day (BID) | ORAL | Status: DC

## 2013-01-22 NOTE — Discharge Summary (Signed)
Physician Discharge Summary  Cynthia Lynn OZH:[id number] DOB: [birth year] DOA: 01/17/2013  PCP: Georgianne Fick, MD  Admit date: 01/17/2013 Discharge date: 01/22/2013  Time spent:  40 minutes  Recommendations for Outpatient Follow-up:  Primary care followup to monitor her blood pressure and check CBC and TSH/free T4 in one week.  Due to debilitation from an acute illness and hospital stay, patient will be sent home with home health physical therapy and optional RN.  Discharge Diagnoses:  Principal Problem:   Colitis Active Problems:   Dehydration   UTI (lower urinary tract infection)   HTN (hypertension)   Hypothyroidism   Leucocytosis   Discharge Condition: Stable, no diarrhea, improved  Diet recommendation: Heart healthy  Filed Weights   01/18/13 0214  Weight: 62.4 kg (137 lb 9.1 oz)    History of present illness at the time of admission:  Cynthia Lynn is a 79 y.o. female history of hypertension and hypothyroidism has been experiencing some dull right lower quadrant pain for last few days. Last evening patient started having profuse diarrhea with weakness and presented to the ER. Patient denies any nausea vomiting fever chills. In the ER UA was consistent with UTI and CT abdomen and pelvis showed features consistent for colitis and also right-sided ascending UTI. Patient denies having used any recent antibiotics.She reports she is wheel chair bound due to weakness in her legs and falls. She is also legally blind.  Hospital Course:   Colitis  The patient was having profuse watery stools. CT abdomen and pelvis showed wall thickening and mucosal hyperenhancement of the descending colon. C. difficile PCR was checked and found to be negative. She was placed on IV Flagyl. Her liquid stools stopped within 48 hours of admission. And she improved quickly. She will be able to complete her course of antibiotic therapy as an outpatient on oral Cipro and Flagyl.  UTI  Urinalysis was  consistent with urinary tract infection. Urine cultures grew Escherichia coli that was susceptible to ciprofloxacin. She was treated with IV Rocephin during her admission and was progressed to oral Cipro at the time of discharge. She is not currently having any burning dysuria or frequency.  Acute renal failure  Mild acute renal sufficiency secondary to dehydration. BUN/CR ratio is more than 20:1.  This is resolved after gentle IV fluid hydration.   Sinus brady - no history of SB noted in chart  After discussing this with her daughter we learned that she does have chronic bradycardia. She remained stable.  Metoprolol was discontinued. We would ask that she followup with her primary care physician as an outpatient.   Hypertension / hypotension  The patient's blood pressures were noted to swing wildly during this admission. Frequently higher in the morning and lower at bedtime. Her metoprolol was discontinued and her lisinopril remained. We advised Cynthia Lynn that it was safer for her blood pressure to run slightly high (with a systolic in the 140s to 160s) rather than to treat her high blood pressure and cause her to become hypotensive. Over the last 24 hours of her admission her systolic blood pressure remained between 101 and 133.  Hypothyroidism / Hyperthyroidism  TSH low (0.314) however, diarrhea resolved. Patient asymptomatic. Will not adjust synthroid at this point, but rather will defer for outpatient follow up.   Dehydration  Secondary to diarrhea  Hgb baseline 15, but has dropped from 15 to 11.7 with IV hydration and serial blood draw. No obvious signs of bleeding. Would request that her primary care  physician check a CBC at her hospital followup visit.   Procedures:  None  Consultations:  None  Discharge Exam: Filed Vitals:   01/21/13 1526 01/21/13 2037 01/22/13 0636 01/22/13 0934  BP: 102/48 133/69 101/54 110/65  Pulse: 72 69 74 65  Temp: 98.6 F (37 C) 98 F (36.7 C) 97.9  F (36.6 C) 97.6 F (36.4 C)  TempSrc: Oral Oral Oral Oral  Resp: 18 18 20 19   Height:      Weight:      SpO2: 95% 95% 92% 95%    General: Awake and alert, elderly pleasant female, well-nourished, well-developed. Utilizes a wheelchair for mobility Cardiovascular: Regular rate and rhythm, soft systolic murmur, minimal nonpitting lower extremity edema bilaterally. Respiratory: Clear to auscultation, no wheezes crackles or rales. Abdomen: Minimal distention, nontender, nondistended, positive bowel sounds, no scars  Discharge Instructions  Discharge Orders   Future Orders Complete By Expires     Diet - low sodium heart healthy  As directed     Increase activity slowly  As directed         Medication List    STOP taking these medications       metoprolol 50 MG tablet  Commonly known as:  LOPRESSOR      TAKE these medications       aspirin 325 MG tablet  Take 325 mg by mouth daily.     ciprofloxacin 500 MG tablet  Commonly known as:  CIPRO  Take 1 tablet (500 mg total) by mouth 2 (two) times daily.     levothyroxine 50 MCG tablet  Commonly known as:  SYNTHROID, LEVOTHROID  Take 50 mcg by mouth daily.     lisinopril 10 MG tablet  Commonly known as:  PRINIVIL,ZESTRIL  Take 2 tablets (20 mg total) by mouth daily.     metroNIDAZOLE 500 MG tablet  Commonly known as:  FLAGYL  Take 1 tablet (500 mg total) by mouth every 8 (eight) hours.     potassium & sodium phosphates 280-160-250 MG Pack  Commonly known as:  PHOS-NAK  Take 1 packet by mouth every morning.           Follow-up Information   Follow up with [facility], MD. Schedule an appointment as soon as possible for a visit in 1 week.   Contact information:   [address] [place] [address] [phone]        The results of significant diagnostics from this hospitalization (including imaging, microbiology, ancillary and laboratory) are listed below for reference.     Significant Diagnostic Studies: Ct Abdomen Pelvis W Contrast  01/18/2013  *RADIOLOGY REPORT*  Clinical Data: Diarrhea, abdominal pain  CT ABDOMEN AND PELVIS WITH CONTRAST  Technique:  Multidetector CT imaging of the abdomen and pelvis was performed following the standard protocol during bolus administration of intravenous contrast.  Contrast: 80mL OMNIPAQUE IOHEXOL 300 MG/ML  SOLN  Comparison: None.  Findings: Mild right lung base opacity and trace fluid. Cardiomegaly.  Coarse mitral annular calcification.  Small hiatal hernia.  Unremarkable liver, spleen, pancreas, right adrenal gland.  There is a lateral limb left adrenal nodule measuring 1 cm, indeterminate.  Symmetric renal enhancement.  There is mild urothelial hyperenhancement of the right renal pelvis without hydroureter.  Pelvic floor laxity.  Advanced colonic diverticulosis. Circumferential descending colonic wall thickening with mucosal hyperenhancement.  Limited evaluation of the transverse colon due to decompressed state.  No bowel obstruction.  No free intraperitoneal air or fluid.  No lymphadenopathy.  Advanced atherosclerotic disease of the aorta and branch vessels.  Mild bladder wall thickening is nonspecific given incomplete distension.  Absent uterus.  No adnexal mass.  Osteopenia and multilevel degenerative change.  No acute osseous finding.  IMPRESSION: Wall thickening and mucosal hyperenhancement of the descending colon.  Differential includes ischemic, infectious, and inflammatory colitis.  Mild bladder wall thickening and urothelial hyperenhancement of the right renal pelvis.  May reflect an ascending infection.  Correlate with urinalysis.  Mild right lung base opacity; scarring versus infiltrate. Trace associated pleural thickening versus fluid.  Small hiatal hernia.  Coarse mitral annular calcification and mild cardiomegaly.  Advanced atherosclerotic disease of the aorta and branch vessels.   Original Report Authenticated By: Jearld Lesch, M.D.    Dg Chest Port 1 View  01/17/2013  *RADIOLOGY REPORT*  Clinical Data: Weakness and diarrhea.  PORTABLE CHEST - 1 VIEW  Comparison: One-view chest 10/01/2007.  Findings: The heart size is normal.  Emphysematous changes again noted.  No focal airspace disease is evident.  The aorta is tortuous. Advanced degenerative changes are noted in the shoulders, worse on the left.  Biapical pleural parenchymal scarring is stable.  IMPRESSION:  1.  No acute cardiopulmonary disease. 2.  Emphysema.   Original Report Authenticated By: Marin Roberts, M.D.     Microbiology: Recent Results (from the past 240 hour(s))  URINE CULTURE     Status: None   Collection Time    01/17/13  9:38 PM      Result Value Range Status   Specimen Description URINE, CATHETERIZED   Final   Special Requests NONE   Final   Culture  Setup Time 01/18/2013 09:32   Final   Colony Count >=100,000 COLONIES/ML   Final   Culture ESCHERICHIA COLI   Final   Report Status 01/19/2013 FINAL   Final   Organism ID, Bacteria ESCHERICHIA COLI   Final  CLOSTRIDIUM DIFFICILE BY PCR     Status: None   Collection Time    01/18/13  7:48 AM      Result Value Range Status   C difficile by pcr NEGATIVE  NEGATIVE Final     Labs: Basic Metabolic Panel:  Recent Labs Lab 01/17/13 2035 01/18/13 0532 01/19/13 0538 01/21/13 0614 01/22/13 0659  NA 137 137 141 140 138  K 4.5 4.4 4.0 3.8 4.1  CL 104 106 112 108 109  CO2 21 21 20 24 24   GLUCOSE 143* 108* 93 121* 90  BUN 42* 41* 28* 20 20  CREATININE 1.12* 1.15* 1.04 0.96 1.08  CALCIUM 9.7 8.4 8.4 8.6 8.4  PHOS  --  4.0  --   --   --    Liver Function Tests:  Recent Labs Lab 01/18/13 0532  AST 20  ALT 10  ALKPHOS 58  BILITOT 0.3  PROT 5.5*  ALBUMIN 2.7*   CBC:  Recent Labs Lab 01/17/13 2035 01/18/13 0532 01/19/13 0538  WBC 18.3* 14.3* 9.2  NEUTROABS 15.7* 11.3*  --   HGB 15.0 12.0 11.7*  HCT 42.0 33.8* 33.2*  MCV 91.7 90.4 91.2  PLT 175 177 151     SignedConley Canal [phone]  [facility] 01/22/2013, 2:55 PM

## 2013-01-22 NOTE — Progress Notes (Signed)
Pt. discharged to floor,verbalized understanding of discharged instruction,medication,restriction,diet and follow up appointment.Baseline Vitals sign stable,Pt comfortable,no sign and symptom of distress. 

## 2013-01-22 NOTE — Care Management Note (Addendum)
    Page 1 of 2   01/22/2013     2:39:47 PM   CARE MANAGEMENT NOTE 01/22/2013  Patient:  Cynthia Lynn, Cynthia Lynn   Account Number:  [id number]  Date Initiated:  01/22/2013  Documentation initiated by:  Letha Cape  Subjective/Objective Assessment:   dx colitis  admit- lives with spouse.     Action/Plan:   pt eval- rec hhpt   Anticipated DC Date:  01/22/2013   Anticipated DC Plan:  HOME W HOME HEALTH SERVICES      DC Planning Services  CM consult      [facility] Choice  HOME HEALTH   Choice offered to / List presented to:  C-1 Patient   DME arranged  BEDSIDE COMMODE      DME agency  [facility]     HH arranged  HH-1 RN  HH-2 PT      [facility] agency  [facility]   Status of service:  Completed, signed off Medicare Important Message given?   (If response is "NO", the following Medicare IM given date fields will be blank) Date Medicare IM given:   Date Additional Medicare IM given:    Discharge Disposition:  HOME W HOME HEALTH SERVICES  Per UR Regulation:  Reviewed for med. necessity/level of care/duration of stay  If discussed at Long Length of Stay Meetings, dates discussed:    Comments:  01/22/13 12:37 Letha Cape RN, BSN [phone] patient lives with spouse, patient chose [facility] for [facility], PT and will need BSC.  Referral made to [facility], Debbie notified. Soc will begin 24-48 hrs post discharge.  Spoke with Aram Beecham  [phone], patient's daughter, she states she wants [facility] as well and she would like to get advance directives started, referral made to Chaplain for advance directives.

## 2013-01-22 NOTE — Discharge Summary (Signed)
Addendum  Patient seen and examined, chart and data base reviewed.  I agree with the above assessment and plan.  For full details please see Mrs. Algis Downs PA note.  Colitis and UTI, improved very well. Patient elected to go home.   Clint Lipps, MD [facility] Pager: [phone] 01/22/2013, 4:54 PM

## 2013-01-22 NOTE — Progress Notes (Signed)
Chaplain visited [phone] in response to a request for advance directives. Pt was awake, alert and responsive. Daughter was present during this visitation. Chaplain explained and asked questions on the Health Care Power of Attorney  for more clarification. Chaplain got two witnesses and had a Dance movement psychotherapist notarized the [employer] of Attorney for the pt. Pt and her daughter Liana Gerold for the help. Kelle Darting [phone]

## 2013-02-10 ENCOUNTER — Encounter (HOSPITAL_COMMUNITY): Payer: Self-pay | Admitting: Emergency Medicine

## 2013-02-10 ENCOUNTER — Emergency Department (HOSPITAL_COMMUNITY)
Admission: EM | Admit: 2013-02-10 | Discharge: 2013-02-10 | Disposition: A | Discharge status: Home / Self Care | Payer: Medicare Other | Attending: Emergency Medicine | Admitting: Emergency Medicine

## 2013-02-10 DIAGNOSIS — E039 Hypothyroidism, unspecified: Secondary | ICD-10-CM | POA: Insufficient documentation

## 2013-02-10 DIAGNOSIS — Z79899 Other long term (current) drug therapy: Secondary | ICD-10-CM | POA: Insufficient documentation

## 2013-02-10 DIAGNOSIS — I10 Essential (primary) hypertension: Secondary | ICD-10-CM | POA: Insufficient documentation

## 2013-02-10 DIAGNOSIS — Z7982 Long term (current) use of aspirin: Secondary | ICD-10-CM | POA: Insufficient documentation

## 2013-02-10 DIAGNOSIS — H548 Legal blindness, as defined in USA: Secondary | ICD-10-CM | POA: Insufficient documentation

## 2013-02-10 LAB — CBC WITH DIFFERENTIAL/PLATELET
Basophils Absolute: 0.1 10*3/uL (ref 0.0–0.1)
HCT: 40.4 % (ref 36.0–46.0)
Lymphocytes Relative: 32 % (ref 12–46)
Neutro Abs: 3.2 10*3/uL (ref 1.7–7.7)
Platelets: 244 10*3/uL (ref 150–400)
RDW: 13.9 % (ref 11.5–15.5)
WBC: 6.2 10*3/uL (ref 4.0–10.5)

## 2013-02-10 LAB — POCT I-STAT, CHEM 8
BUN: 15 mg/dL (ref 6–23)
Chloride: 104 mEq/L (ref 96–112)
Potassium: 3.8 mEq/L (ref 3.5–5.1)
Sodium: 140 mEq/L (ref 135–145)

## 2013-02-10 MED ORDER — CLONIDINE HCL 0.1 MG PO TABS
0.2000 mg | ORAL_TABLET | Freq: Once | ORAL | Status: AC
  Administered 2013-02-10: 0.2 mg via ORAL
  Filled 2013-02-10: qty 2

## 2013-02-10 NOTE — ED Provider Notes (Signed)
History     CSN: [id number]  Arrival date & time 02/10/13  0212   First MD Initiated Contact with Patient 02/10/13 0225      Chief Complaint  Patient presents with  . Hypertension    (Consider location/radiation/quality/duration/timing/severity/associated sxs/prior treatment) The history is provided by the patient.   the patient lives at home and his been compliant with her blood pressure medicines.  She states she was feeling fine this evening but routinely checks her blood pressure.  She noted her blood pressure to be 225/110 at home.  She was without any chest pain or shortness of breath.  She denies abdominal pain.  No complaints of weakness or headache.  No weakness of her upper lower extremities.  She was brought to the emergency department for evaluation.  The patient is 79 years old lives at home with her a 78-year-old husband.  She has a primary care physician.  She is on amlodipine lisinopril for hypertension.  She states compliance with this.  Nothing improves or worsens her symptoms.  She has actually no symptoms at this time.  Past Medical History  Diagnosis Date  . Hypertension   . Hypothyroidism   . Legally blind     Past Surgical History  Procedure Laterality Date  . Abdominal hysterectomy    . Tonsillectomy      Family History  Problem Relation Age of Onset  . CAD Mother   . Liver cancer Sister     History  Substance Use Topics  . Smoking status: Never Smoker   . Smokeless tobacco: Not on file  . Alcohol Use: No    OB History   Grav Para Term Preterm Abortions TAB SAB Ect Mult Living                  Review of Systems  All other systems reviewed and are negative.    Allergies  Review of patient's allergies indicates no known allergies.  Home Medications   Current Outpatient Rx  Name  Route  Sig  Dispense  Refill  . amLODipine (NORVASC) 5 MG tablet   Oral   Take 5 mg by mouth daily.         Marland Kitchen aspirin EC 81 MG tablet   Oral   Take 81  mg by mouth daily.         Marland Kitchen levothyroxine (SYNTHROID, LEVOTHROID) 100 MCG tablet   Oral   Take 100 mcg by mouth daily.         Marland Kitchen lisinopril (PRINIVIL,ZESTRIL) 40 MG tablet   Oral   Take 80 mg by mouth daily.         . potassium chloride (K-DUR) 10 MEQ tablet   Oral   Take 10 mEq by mouth daily.           BP 213/90  Pulse 69  Temp(Src) 98.4 F (36.9 C) (Oral)  Resp 20  SpO2 96%  Physical Exam  Nursing note and vitals reviewed. Constitutional: She is oriented to person, place, and time. She appears well-developed and well-nourished. No distress.  HENT:  Head: Normocephalic and atraumatic.  Eyes: EOM are normal.  Neck: Normal range of motion.  Cardiovascular: Normal rate, regular rhythm and normal heart sounds.   Pulmonary/Chest: Effort normal and breath sounds normal.  Abdominal: Soft. She exhibits no distension. There is no tenderness.  Musculoskeletal: Normal range of motion.  Neurological: She is alert and oriented to person, place, and time.  Skin: Skin is warm and  dry.  Psychiatric: She has a normal mood and affect. Judgment normal.    ED Course  Procedures (including critical care time)  Labs Reviewed  POCT I-STAT, CHEM 8 - Abnormal; Notable for the following:    Glucose, Bld 105 (*)    All other components within normal limits  CBC WITH DIFFERENTIAL   No results found.   1. Hypertension       MDM  4:04 AM  patient presents with asymptomatic hypertension.  She continues to feel good.  Marland Kitchen  No chest pain shortness of breath.  PCP followup.  Discharge home in good condition.  Her blood pressure is improved in the emergency department        Lyanne Co, MD 02/10/13 [phone]

## 2013-02-10 NOTE — ED Notes (Signed)
MD at bedside. 

## 2013-02-10 NOTE — ED Notes (Signed)
Bed:WA24<BR> Expected date:<BR> Expected time:<BR> Means of arrival:<BR> Comments:<BR> EMS

## 2013-02-10 NOTE — ED Notes (Signed)
Per EMS, pt. Is from home with complaint of hypertension,  Pt.'s BP machine reads 225/168mmhg , denies any pain nor weaknesses . Alert and oriented x3. Pt. Has been hypertensive for years.

## 2014-12-06 ENCOUNTER — Emergency Department (HOSPITAL_COMMUNITY): Payer: Medicare Other

## 2014-12-06 ENCOUNTER — Inpatient Hospital Stay (HOSPITAL_COMMUNITY)
Admission: EM | Admit: 2014-12-06 | Discharge: 2014-12-30 | DRG: 064 | Disposition: E | Payer: Medicare Other | Attending: Pulmonary Disease | Admitting: Pulmonary Disease

## 2014-12-06 ENCOUNTER — Encounter (HOSPITAL_COMMUNITY): Payer: Self-pay | Admitting: Emergency Medicine

## 2014-12-06 DIAGNOSIS — Z79899 Other long term (current) drug therapy: Secondary | ICD-10-CM | POA: Diagnosis not present

## 2014-12-06 DIAGNOSIS — G911 Obstructive hydrocephalus: Secondary | ICD-10-CM | POA: Diagnosis present

## 2014-12-06 DIAGNOSIS — J9600 Acute respiratory failure, unspecified whether with hypoxia or hypercapnia: Secondary | ICD-10-CM | POA: Insufficient documentation

## 2014-12-06 DIAGNOSIS — R2990 Unspecified symptoms and signs involving the nervous system: Secondary | ICD-10-CM | POA: Insufficient documentation

## 2014-12-06 DIAGNOSIS — I359 Nonrheumatic aortic valve disorder, unspecified: Secondary | ICD-10-CM | POA: Diagnosis not present

## 2014-12-06 DIAGNOSIS — E039 Hypothyroidism, unspecified: Secondary | ICD-10-CM | POA: Diagnosis not present

## 2014-12-06 DIAGNOSIS — J9690 Respiratory failure, unspecified, unspecified whether with hypoxia or hypercapnia: Secondary | ICD-10-CM | POA: Diagnosis not present

## 2014-12-06 DIAGNOSIS — J189 Pneumonia, unspecified organism: Secondary | ICD-10-CM | POA: Diagnosis present

## 2014-12-06 DIAGNOSIS — G936 Cerebral edema: Secondary | ICD-10-CM | POA: Diagnosis not present

## 2014-12-06 DIAGNOSIS — N183 Chronic kidney disease, stage 3 (moderate): Secondary | ICD-10-CM | POA: Diagnosis not present

## 2014-12-06 DIAGNOSIS — Z7982 Long term (current) use of aspirin: Secondary | ICD-10-CM | POA: Diagnosis not present

## 2014-12-06 DIAGNOSIS — N179 Acute kidney failure, unspecified: Secondary | ICD-10-CM | POA: Diagnosis present

## 2014-12-06 DIAGNOSIS — G9340 Encephalopathy, unspecified: Secondary | ICD-10-CM | POA: Diagnosis present

## 2014-12-06 DIAGNOSIS — H548 Legal blindness, as defined in USA: Secondary | ICD-10-CM | POA: Diagnosis not present

## 2014-12-06 DIAGNOSIS — I129 Hypertensive chronic kidney disease with stage 1 through stage 4 chronic kidney disease, or unspecified chronic kidney disease: Secondary | ICD-10-CM | POA: Diagnosis present

## 2014-12-06 DIAGNOSIS — R918 Other nonspecific abnormal finding of lung field: Secondary | ICD-10-CM | POA: Diagnosis not present

## 2014-12-06 DIAGNOSIS — I6302 Cerebral infarction due to thrombosis of basilar artery: Principal | ICD-10-CM | POA: Diagnosis present

## 2014-12-06 DIAGNOSIS — Z8249 Family history of ischemic heart disease and other diseases of the circulatory system: Secondary | ICD-10-CM | POA: Diagnosis not present

## 2014-12-06 DIAGNOSIS — Z800 Family history of malignant neoplasm of digestive organs: Secondary | ICD-10-CM | POA: Diagnosis not present

## 2014-12-06 DIAGNOSIS — N390 Urinary tract infection, site not specified: Secondary | ICD-10-CM | POA: Diagnosis not present

## 2014-12-06 DIAGNOSIS — R404 Transient alteration of awareness: Secondary | ICD-10-CM | POA: Diagnosis present

## 2014-12-06 DIAGNOSIS — H3530 Unspecified macular degeneration: Secondary | ICD-10-CM | POA: Diagnosis not present

## 2014-12-06 DIAGNOSIS — Z66 Do not resuscitate: Secondary | ICD-10-CM | POA: Diagnosis not present

## 2014-12-06 DIAGNOSIS — I6300 Cerebral infarction due to thrombosis of unspecified precerebral artery: Secondary | ICD-10-CM | POA: Diagnosis not present

## 2014-12-06 DIAGNOSIS — Z4682 Encounter for fitting and adjustment of non-vascular catheter: Secondary | ICD-10-CM | POA: Diagnosis not present

## 2014-12-06 DIAGNOSIS — R451 Restlessness and agitation: Secondary | ICD-10-CM | POA: Diagnosis not present

## 2014-12-06 DIAGNOSIS — E872 Acidosis: Secondary | ICD-10-CM | POA: Diagnosis present

## 2014-12-06 DIAGNOSIS — Z4659 Encounter for fitting and adjustment of other gastrointestinal appliance and device: Secondary | ICD-10-CM

## 2014-12-06 DIAGNOSIS — I639 Cerebral infarction, unspecified: Secondary | ICD-10-CM | POA: Insufficient documentation

## 2014-12-06 DIAGNOSIS — R092 Respiratory arrest: Secondary | ICD-10-CM | POA: Diagnosis not present

## 2014-12-06 DIAGNOSIS — R4020 Unspecified coma: Secondary | ICD-10-CM | POA: Diagnosis not present

## 2014-12-06 DIAGNOSIS — R4189 Other symptoms and signs involving cognitive functions and awareness: Secondary | ICD-10-CM

## 2014-12-06 LAB — CBC WITH DIFFERENTIAL/PLATELET
Basophils Absolute: 0.1 10*3/uL (ref 0.0–0.1)
Basophils Relative: 1 % (ref 0–1)
Eosinophils Absolute: 0.3 10*3/uL (ref 0.0–0.7)
Eosinophils Relative: 3 % (ref 0–5)
HEMATOCRIT: 37.5 % (ref 36.0–46.0)
HEMOGLOBIN: 12.8 g/dL (ref 12.0–15.0)
LYMPHS ABS: 1.9 10*3/uL (ref 0.7–4.0)
Lymphocytes Relative: 22 % (ref 12–46)
MCH: 32 pg (ref 26.0–34.0)
MCHC: 34.1 g/dL (ref 30.0–36.0)
MCV: 93.8 fL (ref 78.0–100.0)
MONO ABS: 0.9 10*3/uL (ref 0.1–1.0)
MONOS PCT: 10 % (ref 3–12)
NEUTROS PCT: 64 % (ref 43–77)
Neutro Abs: 5.7 10*3/uL (ref 1.7–7.7)
Platelets: 222 10*3/uL (ref 150–400)
RBC: 4 MIL/uL (ref 3.87–5.11)
RDW: 15.2 % (ref 11.5–15.5)
WBC: 8.9 10*3/uL (ref 4.0–10.5)

## 2014-12-06 LAB — URINALYSIS, ROUTINE W REFLEX MICROSCOPIC
Bilirubin Urine: NEGATIVE
GLUCOSE, UA: NEGATIVE mg/dL
Ketones, ur: NEGATIVE mg/dL
NITRITE: POSITIVE — AB
PH: 7 (ref 5.0–8.0)
Protein, ur: NEGATIVE mg/dL
Specific Gravity, Urine: 1.013 (ref 1.005–1.030)
Urobilinogen, UA: 0.2 mg/dL (ref 0.0–1.0)

## 2014-12-06 LAB — URINE MICROSCOPIC-ADD ON

## 2014-12-06 LAB — I-STAT ARTERIAL BLOOD GAS, ED
Acid-Base Excess: 3 mmol/L — ABNORMAL HIGH (ref 0.0–2.0)
Bicarbonate: 32.2 mEq/L — ABNORMAL HIGH (ref 20.0–24.0)
O2 Saturation: 100 %
PH ART: 7.264 — AB (ref 7.350–7.450)
TCO2: 34 mmol/L (ref 0–100)
pCO2 arterial: 71.2 mmHg (ref 35.0–45.0)
pO2, Arterial: 387 mmHg — ABNORMAL HIGH (ref 80.0–100.0)

## 2014-12-06 LAB — BASIC METABOLIC PANEL
Anion gap: 7 (ref 5–15)
BUN: 20 mg/dL (ref 6–23)
CHLORIDE: 100 meq/L (ref 96–112)
CO2: 29 mmol/L (ref 19–32)
CREATININE: 1.11 mg/dL — AB (ref 0.50–1.10)
Calcium: 9.1 mg/dL (ref 8.4–10.5)
GFR calc Af Amer: 47 mL/min — ABNORMAL LOW (ref >90)
GFR, EST NON AFRICAN AMERICAN: 41 mL/min — AB (ref >90)
Glucose, Bld: 175 mg/dL — ABNORMAL HIGH (ref 70–99)
POTASSIUM: 3.5 mmol/L (ref 3.5–5.1)
SODIUM: 136 mmol/L (ref 135–145)

## 2014-12-06 LAB — APTT: APTT: 39 s — AB (ref 24–37)

## 2014-12-06 LAB — MRSA PCR SCREENING: MRSA by PCR: NEGATIVE

## 2014-12-06 LAB — PROTIME-INR
INR: 1.19 (ref 0.00–1.49)
Prothrombin Time: 15.3 seconds — ABNORMAL HIGH (ref 11.6–15.2)

## 2014-12-06 LAB — TROPONIN I: TROPONIN I: 0.06 ng/mL — AB (ref <0.031)

## 2014-12-06 LAB — I-STAT CG4 LACTIC ACID, ED: Lactic Acid, Venous: 0.94 mmol/L (ref 0.5–2.2)

## 2014-12-06 MED ORDER — ENOXAPARIN SODIUM 40 MG/0.4ML ~~LOC~~ SOLN: 40.0000 mg | SUBCUTANEOUS | Status: DC

## 2014-12-06 MED ORDER — SODIUM CHLORIDE 0.9 % IV SOLN
INTRAVENOUS | Status: DC
Start: 2014-12-06 — End: 2014-12-10
  Administered 2014-12-06: 75 mL/h via INTRAVENOUS
  Administered 2014-12-07: 11:00:00 via INTRAVENOUS
  Administered 2014-12-07: 1000 mL via INTRAVENOUS
  Administered 2014-12-08: 16:00:00 via INTRAVENOUS
  Administered 2014-12-09: 1000 mL via INTRAVENOUS
  Administered 2014-12-10: 09:00:00 via INTRAVENOUS

## 2014-12-06 MED ORDER — CEFTRIAXONE SODIUM 1 G IJ SOLR
1.0000 g | INTRAMUSCULAR | Status: DC
  Administered 2014-12-06 – 2014-12-08 (×3): 1 g via INTRAVENOUS
  Filled 2014-12-06 (×4): qty 10

## 2014-12-06 MED ORDER — PANTOPRAZOLE SODIUM 40 MG IV SOLR
40.0000 mg | INTRAVENOUS | Status: DC
  Administered 2014-12-06 – 2014-12-09 (×4): 40 mg via INTRAVENOUS
  Filled 2014-12-06 (×5): qty 40

## 2014-12-06 MED ORDER — FENTANYL CITRATE 0.05 MG/ML IJ SOLN: 50.0000 ug | INTRAMUSCULAR | Status: DC | PRN

## 2014-12-06 MED ORDER — SODIUM CHLORIDE 0.9 % IV SOLN
Freq: Once | INTRAVENOUS | Status: AC
  Administered 2014-12-06: 17:00:00 via INTRAVENOUS

## 2014-12-06 MED ORDER — PROPOFOL 10 MG/ML IV EMUL
5.0000 ug/kg/min | Freq: Once | INTRAVENOUS | Status: DC
Start: 2014-12-06 — End: 2014-12-06

## 2014-12-06 MED ORDER — ASPIRIN 300 MG RE SUPP
300.0000 mg | RECTAL | Status: AC
  Administered 2014-12-06: 300 mg via RECTAL
  Filled 2014-12-06: qty 1

## 2014-12-06 MED ORDER — SODIUM CHLORIDE 0.9 % IV SOLN: 250.0000 mL | INTRAVENOUS | Status: DC | PRN

## 2014-12-06 MED ORDER — LEVOTHYROXINE SODIUM 100 MCG PO TABS
100.0000 ug | ORAL_TABLET | Freq: Every day | ORAL | Status: DC
  Administered 2014-12-08 – 2014-12-10 (×3): 100 ug
  Filled 2014-12-06 (×5): qty 1

## 2014-12-06 MED ORDER — CETYLPYRIDINIUM CHLORIDE 0.05 % MT LIQD
7.0000 mL | Freq: Four times a day (QID) | OROMUCOSAL | Status: DC
  Administered 2014-12-07 – 2014-12-10 (×14): 7 mL via OROMUCOSAL

## 2014-12-06 MED ORDER — PROPOFOL 10 MG/ML IV BOLUS: 0.5500 mg | Freq: Once | INTRAVENOUS | Status: DC

## 2014-12-06 MED ORDER — ASPIRIN 81 MG PO CHEW: 324.0000 mg | CHEWABLE_TABLET | ORAL | Status: AC

## 2014-12-06 MED ORDER — CHLORHEXIDINE GLUCONATE 0.12 % MT SOLN
15.0000 mL | Freq: Two times a day (BID) | OROMUCOSAL | Status: DC
  Administered 2014-12-06 – 2014-12-10 (×8): 15 mL via OROMUCOSAL
  Filled 2014-12-06 (×8): qty 15

## 2014-12-06 MED ORDER — ROCURONIUM BROMIDE 50 MG/5ML IV SOLN
80.0000 mg | Freq: Once | INTRAVENOUS | Status: AC
Start: 2014-12-06 — End: 2014-12-06
  Administered 2014-12-06: 80 mg via INTRAVENOUS

## 2014-12-06 MED ORDER — ENOXAPARIN SODIUM 30 MG/0.3ML ~~LOC~~ SOLN
30.0000 mg | SUBCUTANEOUS | Status: DC
  Administered 2014-12-06 – 2014-12-09 (×4): 30 mg via SUBCUTANEOUS
  Filled 2014-12-06 (×5): qty 0.3

## 2014-12-06 MED ORDER — PROPOFOL 10 MG/ML IV BOLUS
50.0000 mg | Freq: Once | INTRAVENOUS | Status: AC
  Administered 2014-12-06: 50 mg via INTRAVENOUS

## 2014-12-06 MED ORDER — PANTOPRAZOLE SODIUM 40 MG PO PACK
40.0000 mg | PACK | Freq: Every day | ORAL | Status: DC
  Filled 2014-12-06: qty 20

## 2014-12-06 NOTE — ED Notes (Signed)
Admitting at bedside, testing reflexes, pt able to move legs with painful stimuli.  Left side reacts slower than right.  Pt has no response in left arm, present in right arm.  Pt pupils fixed at 4mm.

## 2014-12-06 NOTE — ED Notes (Signed)
Attempted OG tube, 3 nurses and EDP with no success.

## 2014-12-06 NOTE — ED Notes (Signed)
Per EMS: Pt found unresponsive at home, breathing 6 times per minute agonally.  EMS attempted intubation, but unsuccessful because of gag reflex.  Pupils pinpoint, pt arrived on board, blocks. 4 of narcan given in route without response. 143 CBG, 200/68.

## 2014-12-06 NOTE — ED Notes (Signed)
Pharmacy at bedside obtained medication and assisted with medication.

## 2014-12-06 NOTE — ED Notes (Addendum)
Pt bilateral feet swollen at baseline per family +3.  Bruises on shins present on arrival to EMS

## 2014-12-06 NOTE — ED Provider Notes (Signed)
CSN: [id number]     Arrival date & time 2014-12-12  1649 History   First MD Initiated Contact with Patient 2014/12/12 1659     No chief complaint on file.    (Consider location/radiation/quality/duration/timing/severity/associated sxs/prior Treatment) Patient is a 81 y.o. female presenting with altered mental status. The history is provided by the EMS personnel. The history is limited by the condition of the patient.  Altered Mental Status Presenting symptoms: unresponsiveness   Severity:  Severe Episode history:  Single Duration: unknown. Timing:  Constant Progression:  Unchanged Chronicity:  New Context: not dementia and taking medications as prescribed   Context comment:  P[place]et was found down by family members prior to arrival, was breathing with a pulse but unreponsive.   Past Medical History  Diagnosis Date  . Hypertension   . Hypothyroidism   . Legally blind    Past Surgical History  Procedure Laterality Date  . Abdominal hysterectomy    . Tonsillectomy     Family History  Problem Relation Age of Onset  . CAD Mother   . Liver cancer Sister    History  Substance Use Topics  . Smoking status: Never Smoker   . Smokeless tobacco: Not on file  . Alcohol Use: No   OB History    No data available     Review of Systems  Unable to perform ROS: Patient unresponsive      Allergies  Review of patient's allergies indicates no known allergies.  Home Medications   Prior to Admission medications   Medication Sig Start Date End Date Taking? Authorizing Provider  amLODipine (NORVASC) 5 MG tablet Take 5 mg by mouth daily.   Yes Historical Provider, MD  aspirin EC 81 MG tablet Take 81 mg by mouth daily.   Yes Historical Provider, MD  levothyroxine (SYNTHROID, LEVOTHROID) 100 MCG tablet Take 100 mcg by mouth daily.   Yes Historical Provider, MD  lisinopril (PRINIVIL,ZESTRIL) 40 MG tablet Take 80 mg by mouth daily.   Yes Historical Provider, MD  potassium chloride (K-DUR)  10 MEQ tablet Take 10 mEq by mouth daily.   Yes Historical Provider, MD   BP 121/65 mmHg  Pulse 63  Temp(Src) 95.5 F (35.3 C) (Rectal)  Resp 20  Ht  (1.626 m)  Wt 137 lb (62.143 kg)  BMI 23.50 kg/m2  SpO2 97% Physical Exam  Constitutional: She appears well-developed and well-nourished.  HENT:  Head: Normocephalic and atraumatic.  Right Ear: External ear normal.  Left Ear: External ear normal.  Mouth/Throat: Oropharynx is clear and moist.  Scant teeth  Eyes:  Pupuls 4mm, equal, round, and sluggishly reactive bilaterally  Neck: No JVD present. No tracheal deviation present. No thyromegaly present.  Cardiovascular: Normal rate, normal heart sounds and intact distal pulses.   Pulmonary/Chest:  Bilateral breath sounds with bagging  Abdominal: Soft. She exhibits no distension.  Musculoskeletal: She exhibits no edema or tenderness.  Neurological: She is unresponsive. GCS eye subscore is 1. GCS verbal subscore is 1. GCS motor subscore is 4.  Skin: Skin is dry. No rash noted. No erythema.  Nursing note and vitals reviewed.   ED Course  INTUBATION Date/Time: 2014/12/12 7:05 PM Performed by: Lula Olszewski Authorized by: Rolland Porter Consent: The procedure was performed in an emergent situation. Patient identity confirmed: anonymous protocol, patient vented/unresponsive Indications: airway protection and  respiratory failure Intubation method: direct Patient status: paralyzed (RSI) Preoxygenation: BVM Sedatives: propofol Paralytic: rocuronium Laryngoscope size: Mac 3 Tube size: 7.5 mm Tube type: cuffed  Number of attempts: 1 Cricoid pressure: yes Cords visualized: yes Post-procedure assessment: chest rise,  ETCO2 monitor and CO2 detector Breath sounds: equal and absent over the epigastrium Cuff inflated: yes ETT to lip: 23 cm Tube secured with: ETT holder Chest x-ray interpreted by me and radiologist. Chest x-ray findings: endotracheal tube in appropriate position    (including critical care time) Labs Review Labs Reviewed  BASIC METABOLIC PANEL - Abnormal; Notable for the following:    Glucose, Bld 175 (*)    Creatinine, Ser 1.11 (*)    GFR calc non Af Amer 41 (*)    GFR calc Af Amer 47 (*)    All other components within normal limits  TROPONIN I - Abnormal; Notable for the following:    Troponin I 0.06 (*)    All other components within normal limits  I-STAT ARTERIAL BLOOD GAS, ED - Abnormal; Notable for the following:    pH, Arterial 7.264 (*)    pCO2 arterial 71.2 (*)    pO2, Arterial 387.0 (*)    Bicarbonate 32.2 (*)    Acid-Base Excess 3.0 (*)    All other components within normal limits  URINE CULTURE  CBC WITH DIFFERENTIAL  URINALYSIS, ROUTINE W REFLEX MICROSCOPIC  I-STAT CG4 LACTIC ACID, ED    Imaging Review Ct Head Wo Contrast  12/02/2014   CLINICAL DATA:  Unresponsive at home.  Agonal breathing.  EXAM: CT HEAD WITHOUT CONTRAST  TECHNIQUE: Contiguous axial images were obtained from the base of the skull through the vertex without intravenous contrast.  COMPARISON:  CT 02/06/2003  FINDINGS: No acute intracranial hemorrhage. No focal mass lesion. No CT evidence of acute infarction. No midline shift or mass effect. No hydrocephalus. Basilar cisterns are patent.  Basilar artery is dense however similar in density to other circle of Willis vessels.  Cortical atrophy and proportional ventricular dilatation. There is periventricular white matter hypodensities. These chronic changes are are progressed from CT 2004. Paranasal sinuses and mastoid air cells are clear.  IMPRESSION: 1. No acute intracranial findings. 2. Progression of chronic findings of atrophy and microvascular disease.   Electronically Signed   By: Genevive BiStewart  Edmunds M.D.   On: 10-21-2015 18:26   Dg Chest Port 1 View  12/01/2014   CLINICAL DATA:  Patient found unresponsive and has been intubated.  EXAM: PORTABLE CHEST - 1 VIEW  COMPARISON:  01/17/2013  FINDINGS: Endotracheal tube tip  lies approximately 1.5 cm above the carina. Temporary pacing pads present overlying the mid and left chest. No edema or pneumothorax identified. There is opacity at the right lung base which may represent atelectasis versus infiltrate. The heart size is normal.  IMPRESSION: Endotracheal tube tip approximately 1.5 cm above the carina. Right basilar atelectasis versus infiltrate.   Electronically Signed   By: Irish LackGlenn  Yamagata M.D.   On: 10-21-2015 17:30     EKG Interpretation   Date/Time:  Friday December 06 2014 16:55:40 EST Ventricular Rate:  72 PR Interval:  235 QRS Duration: 108 QT Interval:  424 QTC Calculation: 464 R Axis:   65 Text Interpretation:  Sinus rhythm Atrial premature complex 1st Degree AVB  Anterior infarct, old Confirmed by Fayrene FearingJAMES  MD, MARK ([id number]) on 12/24/2014  6:23:46 PM      MDM   Final diagnoses:  Unresponsive    The patient is a 81 year old female who presents via EMS after being found down and unresponsive for an unknown length of time at home. EMS reports patient has had strong distal pulses  and adequate blood pressure but was breathing agonally at approximately 6 times a minute on their arrival causing EMS to bag the patient throughout transport. Patient is intubated upon arrival to the ED due to low GCS. EKG as above not showing a STEMI. Head CT with no acute findings. Lab work only remarkable for mildly elevated troponin to 0.06, and hypercarbic respiratory acidosis. The patient's status is discussed with her family members in the emergency department he reports they wish the patient to be a full code. The patient is admitted to the critical care team for further care.   Patient seen with attending, Dr. Fayrene Fearing, who oversaw clinical decision making.    Lula Olszewski, MD 12/08/2014 1911  Rolland Porter, MD 12/16/14 [phone]

## 2014-12-06 NOTE — H&P (Addendum)
PULMONARY / CRITICAL CARE MEDICINE   Name: Cynthia Lynn MRN: [id number] DOB: 02-Jun-1916    ADMISSION DATE:  12/15/2014 CONSULTATION DATE:  12/09/2014  REFERRING MD :  ED  CHIEF COMPLAINT:  Unresponsive  INITIAL PRESENTATION: 81 year old woman with history of HTN, hypothyroidism, macular degeneration presenting with acute encephalopathy.   STUDIES:  CXR 1/8 >> R basilar atelectasis versus infiltrate CT head 1/8 >> no acute intracranial findings  SIGNIFICANT EVENTS: 1/8 >> Intubated in ED   HISTORY OF PRESENT ILLNESS:  81 year old woman with history of HTN, hypothyroidism, macular degeneration presenting with acute encephalopathy. Per granddaughter, she was last seen normal at 10PM the night before. Around 3:45PM the patient's husband informed the family that the patient was unresponsive. She was found sitting in her motorized wheelchair at the dining room table slumped to the right side. She was breathing and had a pulse. It had appeared that she had woken up that morning and gotten dressed as she normally would have. No previous similar episodes. No new medications or recent trauma. Normally lives at home with husband in house connected to her daughter's house, EMS found her to have strong distal pulses and breathing agonally at 6 breaths/min. She was intubated upon arrival to ED for airway protection.  PAST MEDICAL HISTORY :   has a past medical history of Hypertension; Hypothyroidism; and Legally blind.  has past surgical history that includes Abdominal hysterectomy and Tonsillectomy. Prior to Admission medications   Medication Sig Start Date End Date Taking? Authorizing Provider  amLODipine (NORVASC) 5 MG tablet Take 5 mg by mouth daily.   Yes Historical Provider, MD  aspirin EC 81 MG tablet Take 81 mg by mouth daily.   Yes Historical Provider, MD  levothyroxine (SYNTHROID, LEVOTHROID) 100 MCG tablet Take 100 mcg by mouth daily.   Yes Historical Provider, MD  lisinopril  (PRINIVIL,ZESTRIL) 40 MG tablet Take 80 mg by mouth daily.   Yes Historical Provider, MD  potassium chloride (K-DUR) 10 MEQ tablet Take 10 mEq by mouth daily.   Yes Historical Provider, MD   No Known Allergies  FAMILY HISTORY:  has no family status information on file.  SOCIAL HISTORY:  reports that she has never smoked. She does not have any smokeless tobacco history on file. She reports that she does not drink alcohol or use illicit drugs.  REVIEW OF SYSTEMS:  Unable to obtain 2/2 mental status  SUBJECTIVE: Intubated, unresponsive  VITAL SIGNS: Temp:  [95.5 F (35.3 C)] 95.5 F (35.3 C) (01/08 1848) Pulse Rate:  [28-96] 61 (01/08 1945) Resp:  [14-22] 22 (01/08 1945) BP: (104-162)/(49-77) 150/59 mmHg (01/08 1945) SpO2:  [96 %-100 %] 100 % (01/08 1945) FiO2 (%):  [40 %-100 %] 40 % (01/08 1758) Weight:  [137 lb (62.143 kg)] 137 lb (62.143 kg) (01/08 1657) HEMODYNAMICS:   VENTILATOR SETTINGS: Vent Mode:  [-] PRVC FiO2 (%):  [40 %-100 %] 40 % Set Rate:  [14 bmp-22 bmp] 22 bmp Vt Set:  [440 mL] 440 mL PEEP:  [5 cmH20] 5 cmH20 Plateau Pressure:  [16 cmH20-17 cmH20] 16 cmH20 INTAKE / OUTPUT: No intake or output data in the 24 hours ending 12/04/2014 1959  PHYSICAL EXAMINATION: General:  Unresponsive Neuro:  Unresponsive. Withdraws to pain on R greater than L. UE less movement than LE. Plantars upgoing bilaterally. HEENT:  [place]/AT, pupils equal but fixed. Cardiovascular:  RRR Lungs:  Mechanical breath sounds bilaterally Abdomen:  Soft, mildly distended. Musculoskeletal:  BLE 2+ edema to ankles Skin:  No lesions  LABS:  CBC  Recent Labs Lab 12/15/2014 1702  WBC 8.9  HGB 12.8  HCT 37.5  PLT 222   Coag's No results for input(s): APTT, INR in the last 168 hours. BMET  Recent Labs Lab 12/15/2014 1702  NA 136  K 3.5  CL 100  CO2 29  BUN 20  CREATININE 1.11*  GLUCOSE 175*   Electrolytes  Recent Labs Lab 12/28/2014 1702  CALCIUM 9.1   Sepsis Markers  Recent  Labs Lab 12/09/2014 1747  LATICACIDVEN 0.94   ABG  Recent Labs Lab 12/05/2014 1732  PHART 7.264*  PCO2ART 71.2*  PO2ART 387.0*   Liver Enzymes No results for input(s): AST, ALT, ALKPHOS, BILITOT, ALBUMIN in the last 168 hours. Cardiac Enzymes  Recent Labs Lab 12/07/2014 1702  TROPONINI 0.06*   Glucose No results for input(s): GLUCAP in the last 168 hours.  Imaging CXR: RLL patchy infiltrate, ETT in place EKG: Sinus, normal rhythm, <34mm ST elevation in V1, V2, 1mm depression in V4  ASSESSMENT / PLAN:  PULMONARY ETT 1/8 >> A: Ventilator dependent hypercarbic respiratory failure, intubated for airway protection P:   PRVC, wean as tolerated Daily SAT's/SBTs VAP prevention per protocol Follow ABGs  CARDIOVASCULAR CVL none A:  HTN Rule out ACS P:  Trend troponins Telemetry Repeat EKG in AM 2D echo Home amlodpine  daily and lisinopril  daily held - will allow for permissive hypertension in setting of ?CVA ASA daily  RENAL A:  AKI on CKD stage 3 P:   NS @ 75 ml/hr  GASTROINTESTINAL A:  No acute issues P:   NPO Protonix for SUP Consider TF if patient remains intubated  HEMATOLOGIC A:  VTE ppx P:  SCDs Lovenox daily Follow CBC  INFECTIOUS A:   UTI R basilar infiltrate - ?PNA P:   BCx2 1/8 >> UC 1/8 >> Sputum 1/8>>  Abx: ceftriaxone, start date 1/8, day 1  ENDOCRINE A:  Hypothyroidism P:   Cont home synthroid daily TSH  NEUROLOGIC A:  Acute encephalopathy 2/2 ?stroke, infection, ?ACS P:   RASS goal: -1 MRI brain EEG Neuro consulted fent prn   FAMILY  - Updates: Daughter and husband updated at bedside  - Inter-disciplinary family meet or Palliative Care meeting due by:  12/13/2014  Griffin Basil, MD   STAFF NOTE: I have personally seen and evaluated the patient and reviewed the available data. I have discussed the patient with the housestaff officer or NP. I agree with the resident's note as documented  above.  Briefly: 89yof, fairly independent at baseline, presents with AMS after being found unresponsive at home.  Diff dx is ACS vs CVA vs metabolic encephalopathy from UTI +/+ developing PNA.  Pt normotensive and with sinus rhythm and minimal changes on EKG at time of my eval.  Upgoing toes noted bilaterally as well as limited response to pain on left side.  Will cycle CE's, monitor on tele, neuro c/s, MRI brain, targeted Abx for UTI, limit sedation.    The patient is critically ill with multiple organ systems failure and requires high complexity decision making for assessment and support, frequent evaluation and titration of therapies, application of advanced monitoring technologies and extensive interpretation of multiple databases. Critical Care Time devoted to patient care services described in this note is 55 minutes.  Joen Laura, MD   Pulmonary and Critical Care Medicine [facility] Pager: [phone]  12/11/2014, 7:59 PM

## 2014-12-06 NOTE — ED Notes (Signed)
Called respiratory to notify of transport.

## 2014-12-06 NOTE — ED Notes (Signed)
edp at bedside  

## 2014-12-06 NOTE — Consult Note (Signed)
Reason for Consult:Unresponsive Referring Physician: Marchelle Gearing  CC: Unresponsive  HPI: Cynthia Lynn is an 81 y.o. female found unresponsive at home.  Unclear how long patient was down.  Patient intubated by EMS and transported to the ED.  Was felt tot have some asymmetry with stimulation and consult called for further evaluation.    Past Medical History  Diagnosis Date  . Hypertension   . Hypothyroidism   . Legally blind     Past Surgical History  Procedure Laterality Date  . Abdominal hysterectomy    . Tonsillectomy      Family History  Problem Relation Age of Onset  . CAD Mother   . Liver cancer Sister     Social History:  reports that she has never smoked. She does not have any smokeless tobacco history on file. She reports that she does not drink alcohol or use illicit drugs.  No Known Allergies  Medications:  I have reviewed the patient's current medications. Prior to Admission:  Prescriptions prior to admission  Medication Sig Dispense Refill Last Dose  . amLODipine (NORVASC) 5 MG tablet Take 5 mg by mouth daily.   12/23/2014 at Unknown time  . aspirin EC 81 MG tablet Take 81 mg by mouth daily.   12/18/2014 at Unknown time  . levothyroxine (SYNTHROID, LEVOTHROID) 100 MCG tablet Take 100 mcg by mouth daily.   11/29/2014 at Unknown time  . lisinopril (PRINIVIL,ZESTRIL) 40 MG tablet Take 80 mg by mouth daily.   12/09/2014 at Unknown time  . potassium chloride (K-DUR) 10 MEQ tablet Take 10 mEq by mouth daily.   12/16/2014 at Unknown time   Scheduled: . cefTRIAXone (ROCEPHIN)  IV  1 g Intravenous Q24H  . enoxaparin (LOVENOX) injection  40 mg Subcutaneous Q24H  . pantoprazole sodium  40 mg Per Tube Daily    ROS: Unable to provide  Physical Examination: Blood pressure 154/66, pulse 64, temperature 94.2 F (34.6 C), temperature source Rectal, resp. rate 22, height  (1.626 m), weight 62.143 kg (137 lb), SpO2 100 %.  HEENT-  Normocephalic, no lesions, without obvious  abnormality.  Normal external eye and conjunctiva.  Normal TM's bilaterally.  Normal auditory canals and external ears. Normal external nose, mucus membranes and septum.  Normal pharynx. Cardiovascular- S1, S2 normal, pulses palpable throughout   Lungs- chest clear, no wheezing, rales, normal symmetric air entry Abdomen- soft, non-tender; bowel sounds normal; no masses,  no organomegaly Extremities- 2+ edema in the BLE's Lymph-no adenopathy palpable Musculoskeletal-joint swelling noted Skin-skin echymoses noted  Neurological Examination Mental Status: Patient does not respond to verbal stimuli.  Does not respond to deep sternal rub.  Does not follow commands.  No verbalizations noted.  Cranial Nerves: II: patient does not respond confrontation bilaterally, pupils right 4 mm, left 3 mm,and unreactive bilaterally III,IV,VI: doll's response absent bilaterally.  V,VII: corneal reflex absent bilaterally  VIII: patient does not respond to verbal stimuli IX,X: gag reflex reduced, XI: trapezius strength unable to test bilaterally XII: tongue strength unable to test Motor: Extremities flaccid throughout.  No spontaneous movement noted.  No purposeful movements noted. Sensory: Does not respond to noxious stimuli in any extremity. Deep Tendon Reflexes:  1+ in the upper extremities, absent at the knees and 1+ at the ankles.   Plantars: upgoing bilaterally Cerebellar: Unable to perform   Laboratory Studies:   Basic Metabolic Panel:  Recent Labs Lab 12/27/2014 1702  NA 136  K 3.5  CL 100  CO2 29  GLUCOSE 175*  BUN 20  CREATININE 1.11*  CALCIUM 9.1    Liver Function Tests: No results for input(s): AST, ALT, ALKPHOS, BILITOT, PROT, ALBUMIN in the last 168 hours. No results for input(s): LIPASE, AMYLASE in the last 168 hours. No results for input(s): AMMONIA in the last 168 hours.  CBC:  Recent Labs Lab 12/12/2014 1702  WBC 8.9  NEUTROABS 5.7  HGB 12.8  HCT 37.5  MCV 93.8   PLT 222    Cardiac Enzymes:  Recent Labs Lab 12/11/2014 1702  TROPONINI 0.06*    BNP: Invalid input(s): POCBNP  CBG: No results for input(s): GLUCAP in the last 168 hours.  Microbiology: Results for orders placed or performed during the hospital encounter of 01/17/13  Urine culture     Status: None   Collection Time: 01/17/13  9:38 PM  Result Value Ref Range Status   Specimen Description URINE, CATHETERIZED  Final   Special Requests NONE  Final   Culture  Setup Time 01/18/2013 09:32  Final   Colony Count >=100,000 COLONIES/ML  Final   Culture ESCHERICHIA COLI  Final   Report Status 01/19/2013 FINAL  Final   Organism ID, Bacteria ESCHERICHIA COLI  Final      Susceptibility   Escherichia coli - MIC*    AMPICILLIN 4 Sensitive     CEFAZOLIN <=4 Sensitive     CEFTRIAXONE <=1 Sensitive     CIPROFLOXACIN <=0.25 Sensitive     GENTAMICIN <=1 Sensitive     LEVOFLOXACIN <=0.12 Sensitive     NITROFURANTOIN <=16 Sensitive     TOBRAMYCIN <=1 Sensitive     TRIMETH/SULFA <=20 Sensitive     PIP/TAZO <=4 Sensitive     * ESCHERICHIA COLI  Clostridium Difficile by PCR     Status: None   Collection Time: 01/18/13  7:48 AM  Result Value Ref Range Status   C difficile by pcr NEGATIVE NEGATIVE Final    Coagulation Studies: No results for input(s): LABPROT, INR in the last 72 hours.  Urinalysis:  Recent Labs Lab 12/16/2014 1840  COLORURINE YELLOW  LABSPEC 1.013  PHURINE 7.0  GLUCOSEU NEGATIVE  HGBUR TRACE*  BILIRUBINUR NEGATIVE  KETONESUR NEGATIVE  PROTEINUR NEGATIVE  UROBILINOGEN 0.2  NITRITE POSITIVE*  LEUKOCYTESUR MODERATE*    Lipid Panel:  No results found for: CHOL, TRIG, HDL, CHOLHDL, VLDL, LDLCALC  HgbA1C: No results found for: HGBA1C  Urine Drug Screen:  No results found for: LABOPIA, COCAINSCRNUR, LABBENZ, AMPHETMU, THCU, LABBARB  Alcohol Level: No results for input(s): ETH in the last 168 hours.  Other results: EKG: sinus rhythm at 72 bpm.  Imaging: Ct  Head Wo Contrast  12/08/2014   CLINICAL DATA:  Unresponsive at home.  Agonal breathing.  EXAM: CT HEAD WITHOUT CONTRAST  TECHNIQUE: Contiguous axial images were obtained from the base of the skull through the vertex without intravenous contrast.  COMPARISON:  CT 02/06/2003  FINDINGS: No acute intracranial hemorrhage. No focal mass lesion. No CT evidence of acute infarction. No midline shift or mass effect. No hydrocephalus. Basilar cisterns are patent.  Basilar artery is dense however similar in density to other circle of Willis vessels.  Cortical atrophy and proportional ventricular dilatation. There is periventricular white matter hypodensities. These chronic changes are are progressed from CT 2004. Paranasal sinuses and mastoid air cells are clear.  IMPRESSION: 1. No acute intracranial findings. 2. Progression of chronic findings of atrophy and microvascular disease.   Electronically Signed   By: Genevive BiStewart  Edmunds M.D.   On: 12/02/2014 18:26  Dg Chest Port 1 View  12/18/2014   CLINICAL DATA:  Patient found unresponsive and has been intubated.  EXAM: PORTABLE CHEST - 1 VIEW  COMPARISON:  01/17/2013  FINDINGS: Endotracheal tube tip lies approximately 1.5 cm above the carina. Temporary pacing pads present overlying the mid and left chest. No edema or pneumothorax identified. There is opacity at the right lung base which may represent atelectasis versus infiltrate. The heart size is normal.  IMPRESSION: Endotracheal tube tip approximately 1.5 cm above the carina. Right basilar atelectasis versus infiltrate.   Electronically Signed   By: Irish Lack M.D.   On: 2014/12/18 17:30     Assessment/Plan: 81 year old female found unresponsive.  Patient currently intubated and sedated.  Previously with some minor focality noted on examination.  Currently due to sedation unable to elicit any responses particularly.  Head CT personally reviewed and shows no acute changes.  Lab work reveals a UTI and chest x-ray a  possible pneumonia.  Mental status may be metabolic in nature but will rule out a possible infarct.  Recommendations: 1.  MRI of the brain without contrast 2.  Continue ASA daily 3.  Would not initiate a stroke work up unless MRI indicative of an ischemic event   4.  Agree with treatment of infection  Thana Farr, MD [facility] [phone] 12/18/2014, 9:25 PM

## 2014-12-06 NOTE — ED Provider Notes (Signed)
Pt seen upon arrival with Dr. Festus AloeGoebel.  History of being found slumped to the side in her motorized WC at home by her husband.  Husband has a history of profound dementia. She and her husband live in the same structure /home as her  family, however down the hallway in a separate "mother-in-law" arrangement.  Family does not know the length of time that she was in this condition, however they state that it "probably wasn't very long".  Primary shrug to find her slumped in the chair. Had some agonal breaths. Had pulses and pressures in a sinus rhythm.  Patient is "full code" per family's report. Does NOT have a DO NOT RESUSCITATE. This is reported to paramedics. This is confirmed upon their arrival here. Family states that "she would want to have everything done".  No reported symptoms. No recent illness.  Patient with agonal sonorous respirations upon arrival. Intubated.  I was present and assisted with the intubation was without difficulty. Chest x-ray confirms proper positioning. CT scan shows no obvious hemorrhage.  Patient was bag assisted ventilations en route and upon her arrival. Following this, and after intubation ventilation, PCo2 at 71 with respiratory acidosis.  Etiology of her respiratory arrest is not clear at this time.    Rolland PorterMark Sheily Lineman, MD 06-May-2015 1901

## 2014-12-06 NOTE — ED Notes (Signed)
Pt placed on zoll.

## 2014-12-06 NOTE — ED Notes (Signed)
Warm blankets applied to pt.  

## 2014-12-06 NOTE — ED Notes (Signed)
Spoke to CT, told to come in two minutes.

## 2014-12-06 NOTE — ED Notes (Signed)
Pt found unresponsive at home, breathing 6 times per minute agonally.  EMS attempted intubation, but unsuccessful because of gag reflex.  Pupils pinpoint, pt arrived on board, blocks. 4 of narcan given in route without response

## 2014-12-06 NOTE — ED Notes (Signed)
Critical care at bedside  

## 2014-12-06 NOTE — ED Notes (Signed)
Admitting MD at bedside.

## 2014-12-06 NOTE — ED Notes (Signed)
Called CT to request stat scan of pt. CT informed nurse that all scanners were full and one was under construction.

## 2014-12-06 NOTE — ED Notes (Signed)
Doctor beginning to bag pt.

## 2014-12-07 ENCOUNTER — Inpatient Hospital Stay (HOSPITAL_COMMUNITY): Payer: Medicare Other

## 2014-12-07 DIAGNOSIS — J9600 Acute respiratory failure, unspecified whether with hypoxia or hypercapnia: Secondary | ICD-10-CM | POA: Insufficient documentation

## 2014-12-07 DIAGNOSIS — G9340 Encephalopathy, unspecified: Secondary | ICD-10-CM

## 2014-12-07 DIAGNOSIS — R2990 Unspecified symptoms and signs involving the nervous system: Secondary | ICD-10-CM | POA: Insufficient documentation

## 2014-12-07 DIAGNOSIS — I359 Nonrheumatic aortic valve disorder, unspecified: Secondary | ICD-10-CM

## 2014-12-07 LAB — BASIC METABOLIC PANEL
ANION GAP: 11 (ref 5–15)
BUN: 18 mg/dL (ref 6–23)
CHLORIDE: 103 meq/L (ref 96–112)
CO2: 25 mmol/L (ref 19–32)
Calcium: 8.7 mg/dL (ref 8.4–10.5)
Creatinine, Ser: 1.04 mg/dL (ref 0.50–1.10)
GFR calc Af Amer: 51 mL/min — ABNORMAL LOW (ref >90)
GFR calc non Af Amer: 44 mL/min — ABNORMAL LOW (ref >90)
Glucose, Bld: 108 mg/dL — ABNORMAL HIGH (ref 70–99)
POTASSIUM: 4.5 mmol/L (ref 3.5–5.1)
SODIUM: 139 mmol/L (ref 135–145)

## 2014-12-07 LAB — GLUCOSE, CAPILLARY
Glucose-Capillary: 115 mg/dL — ABNORMAL HIGH (ref 70–99)
Glucose-Capillary: 116 mg/dL — ABNORMAL HIGH (ref 70–99)

## 2014-12-07 LAB — POCT I-STAT 3, ART BLOOD GAS (G3+)
Acid-Base Excess: 5 mmol/L — ABNORMAL HIGH (ref 0.0–2.0)
Bicarbonate: 27.3 mEq/L — ABNORMAL HIGH (ref 20.0–24.0)
O2 Saturation: 99 %
PCO2 ART: 32.8 mmHg — AB (ref 35.0–45.0)
TCO2: 28 mmol/L (ref 0–100)
pH, Arterial: 7.528 — ABNORMAL HIGH (ref 7.350–7.450)
pO2, Arterial: 117 mmHg — ABNORMAL HIGH (ref 80.0–100.0)

## 2014-12-07 LAB — MAGNESIUM: Magnesium: 1.8 mg/dL (ref 1.5–2.5)

## 2014-12-07 LAB — CBC
HCT: 33 % — ABNORMAL LOW (ref 36.0–46.0)
Hemoglobin: 11.5 g/dL — ABNORMAL LOW (ref 12.0–15.0)
MCH: 32.4 pg (ref 26.0–34.0)
MCHC: 34.8 g/dL (ref 30.0–36.0)
MCV: 93 fL (ref 78.0–100.0)
Platelets: 181 10*3/uL (ref 150–400)
RBC: 3.55 MIL/uL — AB (ref 3.87–5.11)
RDW: 15.2 % (ref 11.5–15.5)
WBC: 9 10*3/uL (ref 4.0–10.5)

## 2014-12-07 LAB — PHOSPHORUS: Phosphorus: 3 mg/dL (ref 2.3–4.6)

## 2014-12-07 LAB — TROPONIN I
Troponin I: 0.12 ng/mL — ABNORMAL HIGH (ref <0.031)
Troponin I: 0.12 ng/mL — ABNORMAL HIGH (ref <0.031)
Troponin I: 0.1 ng/mL — ABNORMAL HIGH (ref <0.031)

## 2014-12-07 MED ORDER — VITAL HIGH PROTEIN PO LIQD
1000.0000 mL | ORAL | Status: DC
  Filled 2014-12-07 (×2): qty 1000

## 2014-12-07 MED ORDER — VITAL AF 1.2 CAL PO LIQD
1000.0000 mL | ORAL | Status: DC
  Administered 2014-12-07 – 2014-12-09 (×3): 1000 mL
  Filled 2014-12-07 (×7): qty 1000

## 2014-12-07 NOTE — Progress Notes (Signed)
Dr Hosie PoissonSumner aware of decreased GCS with extension flexion in all extremeties

## 2014-12-07 NOTE — Progress Notes (Signed)
PULMONARY / CRITICAL CARE MEDICINE   Name: Cynthia Lynn MRN: [id number] DOB: 09/26/1916    ADMISSION DATE:  12/08/2014 CONSULTATION DATE:  12/22/2014  REFERRING MD :  ED  CHIEF COMPLAINT:  Unresponsive  INITIAL PRESENTATION: 81 year old woman with history of HTN, hypothyroidism, macular degeneration presenting with acute encephalopathy.   STUDIES:  CXR 1/8 >> R basilar atelectasis versus infiltrate CT head 1/8 >> no acute intracranial findings EEG 1/9>>> CT head 1/10>>>  SIGNIFICANT EVENTS: 1/8 >> Intubated in ED 1/9: family discussion. Made DNR.  SUBJECTIVE: Intubated, unresponsive  VITAL SIGNS: Temp:  [94.2 F (34.6 C)-99.6 F (37.6 C)] 99.6 F (37.6 C) (01/09 0800) Pulse Rate:  [28-96] 63 (01/09 1100) Resp:  [14-22] 22 (01/09 1100) BP: (104-179)/(49-85) 143/58 mmHg (01/09 1100) SpO2:  [96 %-100 %] 99 % (01/09 1100) FiO2 (%):  [40 %-100 %] 40 % (01/09 0800) Weight:  [62.143 kg (137 lb)] 62.143 kg (137 lb) (01/08 1657) HEMODYNAMICS:   VENTILATOR SETTINGS: Vent Mode:  [-] PRVC FiO2 (%):  [40 %-100 %] 40 % Set Rate:  [14 bmp-22 bmp] 22 bmp Vt Set:  [440 mL] 440 mL PEEP:  [5 cmH20] 5 cmH20 Plateau Pressure:  [16 cmH20-19 cmH20] 19 cmH20 INTAKE / OUTPUT:  Intake/Output Summary (Last 24 hours) at 12/07/14 1136 Last data filed at 12/07/14 0800  Gross per 24 hour  Intake    750 ml  Output   1525 ml  Net   -775 ml    PHYSICAL EXAMINATION: General:  Unresponsive Neuro:  Unresponsive. Withdraws to pain on R greater than L. UE less movement than LE. Plantars upgoing bilaterally. No change  HEENT:  [place]/AT, pupils equal but fixed. Cardiovascular:  RRR Lungs:  Mechanical breath sounds bilaterally, otherwise clear  Abdomen:  Soft, mildly distended. Musculoskeletal:  BLE 2+ edema to ankles Skin:  No lesions   LABS:  CBC  Recent Labs Lab 12/02/2014 1702 12/07/14 0204  WBC 8.9 9.0  HGB 12.8 11.5*  HCT 37.5 33.0*  PLT 222 181   Coag's  Recent Labs Lab  12/25/2014 2120  APTT 39*  INR 1.19   BMET  Recent Labs Lab 12/13/2014 1702 12/07/14 0204  NA 136 139  K 3.5 4.5  CL 100 103  CO2 29 25  BUN 20 18  CREATININE 1.11* 1.04  GLUCOSE 175* 108*   Electrolytes  Recent Labs Lab 12/08/2014 1702 12/22/2014 2310 12/07/14 0204  CALCIUM 9.1  --  8.7  MG  --  1.8  --   PHOS  --  3.0  --    Sepsis Markers  Recent Labs Lab 12/15/2014 1747  LATICACIDVEN 0.94   ABG  Recent Labs Lab 12/21/2014 1732 12/07/14 0301  PHART 7.264* 7.528*  PCO2ART 71.2* 32.8*  PO2ART 387.0* 117.0*   Liver Enzymes No results for input(s): AST, ALT, ALKPHOS, BILITOT, ALBUMIN in the last 168 hours. Cardiac Enzymes  Recent Labs Lab 12/17/2014 2310 12/07/14 0204 12/07/14 0740  TROPONINI 0.12* 0.12* 0.10*   Glucose No results for input(s): GLUCAP in the last 168 hours.  Imaging CXR: RLL patchy infiltrate, ETT in place EKG: Sinus, normal rhythm, <14mm ST elevation in V1, V2, 48mm depression in V4  ASSESSMENT / PLAN:  PULMONARY ETT 1/8 >> A: Ventilator dependent hypercarbic respiratory failure, intubated for airway protection     Possible PNA  P:   PRVC, wean as tolerated Daily SAT's/SBTs VAP prevention per protocol Follow ABGs (vent changes made for her resp alk) F/u pcxr   CARDIOVASCULAR CVL  none A:  HTN Mild troponin bump. Could reflect mild demand ischemia  P:  Trend troponins Telemetry F/u 2D echo Home amlodpine $RemoveBefore'5mg'iSJEKwIHMJEzH$  daily and lisinopril $RemoveBefore'80mg'[id number]$  daily held - will allow for permissive hypertension in setting of ?CVA ASA daily Not a candidate for cardiac interventions   RENAL A:  AKI on CKD stage 3 P:   NS @ 75 ml/hr  GASTROINTESTINAL A:  No acute issues P:   Place NGT Start tubefeeds   HEMATOLOGIC A:  VTE ppx P:  SCDs Lovenox daily Follow CBC  INFECTIOUS A:   UTI R basilar infiltrate - ?PNA P:   BCx2 1/8 >> UC 1/8 >> Sputum 1/8>>  Abx: ceftriaxone, start date 1/8, day 2 >>  ENDOCRINE A:   Hypothyroidism P:    Cont home synthroid 154mcg daily TSH  NEUROLOGIC A:   Acute encephalopathy 2/2 ?stroke, infection, ?ACS  Her MS is c/w something catastrophic  P:   RASS goal: -1 EEG Neuro consulted fent prn   FAMILY  - Updates: Daughter and husband updated at bedside  - Inter-disciplinary family meet or Palliative Care meeting due by:  12/13/2014  NP SUMMARY  Briefly: 89yof, fairly independent at baseline, presents with AMS after being found unresponsive at home.  Diff dx is ACS vs CVA vs metabolic encephalopathy from UTI +/+ developing PNA.  Pt normotensive and with sinus rhythm. Little to gain by MRI so will d/c. Have set limitations to care w/ family. Now DNR. Will f/u EEG and repeat CT head on 1/10. Suspect that this is a catastrophic injury.   Cynthia Lynn ACNP-BC [place] Pager # [phone] OR # [phone] if no answer 12/07/2014, 11:36 AM  Attending Note:  I have examined patient, reviewed labs, studies and notes. I have discussed the case with Cynthia Lynn, and I agree with the data and plans as amended above. Pt admitted with severe and precipitous change in MS resulting in respiratory failure. No clear etiology for her encephalopathy, but suspect CVA. Consider also toxic metabolic effects from UTI. Currently unresponsive on exam. Supporting with MV, abx, standard medical care. Code status reviewed with family - now DNR in event of a decompensation. Independent critical care time is 45 minutes.   Baltazar Apo, MD, PhD 12/07/2014, 2:11 PM [facility] [phone] or if no answer [phone]

## 2014-12-07 NOTE — Progress Notes (Addendum)
INITIAL NUTRITION ASSESSMENT  DOCUMENTATION CODES Per approved criteria  -Not Applicable   INTERVENTION: -Initiate Vital AF 1.2  @ 20 ml/hr via OGT and increase by 10 ml every 4 hours to goal rate of 50 ml/hr.  -Tube feeding regimen provides 1440 kcal (106% of needs), 90 grams of protein, and 973 ml of H2O.  -RD to follow for goals of care  NUTRITION DIAGNOSIS: Inadequate oral intake related to inability to eat as evidenced by NPO.   Goal: Pt will meet >90% of estimated nutritional needs  Monitor:  Respiratory status, initiation of TF, labs, weight changes, I/O's, goals of care  Reason for Assessment: VDRF, Consult for TF initiation and management  81 y.o. female  Admitting Dx: <principal problem not specified>  81 year old woman with history of HTN, hypothyroidism, macular degeneration presenting with acute encephalopathy.   ASSESSMENT: Patient is currently intubated on ventilator support. OGT in place.  MV: 9.1 L/min Temp (24hrs), Avg:96.9 F (36.1 C), Min:94.2 F (34.6 C), Max:99.6 F (37.6 C)  Propofol: n/a   No family present at time of visit.  Work-up reveals UTI and possible pneumonia.  Per chart review, MD to discuss goals of care with family. MRI is being held at this point. Per neurology, only work up if evidence of ischemic event.  Wt hx reveals stability over the past year. Nutriton-focused physical exam WDL.  Labs reviewed. Glucose: 108. Mg, K, and Phos WDL.   Height: Ht Readings from Last 1 Encounters:  12/17/2014 5\' 4"  (1.626 m)    Weight: Wt Readings from Last 1 Encounters:  12/14/2014 137 lb (62.143 kg)    Ideal Body Weight: 120#  % Ideal Body Weight: 114%  Wt Readings from Last 10 Encounters:  12/13/2014 137 lb (62.143 kg)  01/18/13 137 lb 9.1 oz (62.4 kg)    Usual Body Weight: 137#  % Usual Body Weight: 100%  BMI:  Body mass index is 23.5 kg/(m^2). Normal weight range  Estimated Nutritional Needs: Kcal: 1363.0 Protein: 80-90  grams Fluid: >1.5 L  Skin: Intact, edema to BLEs   Diet Order: Diet NPO time specified  EDUCATION NEEDS: -Education not appropriate at this time   Intake/Output Summary (Last 24 hours) at 12/07/14 0918 Last data filed at 12/07/14 0800  Gross per 24 hour  Intake    750 ml  Output   1525 ml  Net   -775 ml    Last BM: PTA  Labs:   Recent Labs Lab 12/05/2014 1702 12/25/2014 2310 12/07/14 0204  NA 136  --  139  K 3.5  --  4.5  CL 100  --  103  CO2 29  --  25  BUN 20  --  18  CREATININE 1.11*  --  1.04  CALCIUM 9.1  --  8.7  MG  --  1.8  --   PHOS  --  3.0  --   GLUCOSE 175*  --  108*    CBG (last 3)  No results for input(s): GLUCAP in the last 72 hours.  Scheduled Meds: . antiseptic oral rinse  7 mL Mouth Rinse QID  . cefTRIAXone (ROCEPHIN)  IV  1 g Intravenous Q24H  . chlorhexidine  15 mL Mouth Rinse BID  . enoxaparin (LOVENOX) injection  30 mg Subcutaneous Q24H  . levothyroxine  100 mcg Per Tube QAC breakfast  . pantoprazole (PROTONIX) IV  40 mg Intravenous Q24H    Continuous Infusions: . sodium chloride 75 mL/hr (12/12/2014 2110)    Past  Medical History  Diagnosis Date  . Hypertension   . Hypothyroidism   . Legally blind     Past Surgical History  Procedure Laterality Date  . Abdominal hysterectomy    . Tonsillectomy      Dom Haverland A. Mayford Knife, RD, LDN, CDE Pager: [phone] After hours Pager: [phone]

## 2014-12-07 NOTE — Progress Notes (Signed)
Subjective: No overnight events. Found to have a + UA in the ED.   Objective: Current vital signs: BP 165/68 mmHg  Pulse 71  Temp(Src) 98.6 F (37 C) (Oral)  Resp 21  Ht  (1.626 m)  Wt 62.143 kg (137 lb)  BMI 23.50 kg/m2  SpO2 100% Vital signs in last 24 hours: Temp:  [94.2 F (34.6 C)-98.6 F (37 C)] 98.6 F (37 C) (01/09 0400) Pulse Rate:  [28-96] 71 (01/09 0800) Resp:  [14-22] 21 (01/09 0800) BP: (104-179)/(49-85) 165/68 mmHg (01/09 0800) SpO2:  [96 %-100 %] 100 % (01/09 0800) FiO2 (%):  [40 %-100 %] 40 % (01/09 0800) Weight:  [62.143 kg (137 lb)] 62.143 kg (137 lb) (01/08 1657)  Intake/Output from previous day: 01/08 0701 - 01/09 0700 In: 675 [I.V.:675] Out: 1525 [Urine:1525] Intake/Output this shift: Total I/O In: 75 [I.V.:75] Out: -  Nutritional status: Diet NPO time specified  Neurologic Exam: Mental Status: Patient does not respond to verbal stimuli. Mild extensor posturing with noxious stimuli. Does not follow commands. No verbalizations noted.  Cranial Nerves: II: patient does not respond confrontation bilaterally, pupils right 4 mm, left 3 mm,and unreactive bilaterally III,IV,VI: doll's response absent bilaterally.  V,VII: corneal reflex absent bilaterally  VIII: patient does not respond to verbal stimuli IX,X: weak to no cough reflex noted XI: trapezius strength unable to test bilaterally XII: tongue strength unable to test Motor: Extremities flaccid throughout. No spontaneous movement noted. No purposeful movements noted.  Sensory: Does not respond to noxious stimuli in any extremity. LE triple flexion noted Deep Tendon Reflexes:  1+ in the upper extremities, absent at the knees and 1+ at the ankles.  Plantars: upgoing bilaterally Cerebellar: Unable to perform  Lab Results: Basic Metabolic Panel:  Recent Labs Lab 12/15/2014 1702 12/22/2014 2310 12/07/14 0204  NA 136  --  139  K 3.5  --  4.5  CL 100  --  103  CO2 29  --  25   GLUCOSE 175*  --  108*  BUN 20  --  18  CREATININE 1.11*  --  1.04  CALCIUM 9.1  --  8.7  MG  --  1.8  --   PHOS  --  3.0  --     Liver Function Tests: No results for input(s): AST, ALT, ALKPHOS, BILITOT, PROT, ALBUMIN in the last 168 hours. No results for input(s): LIPASE, AMYLASE in the last 168 hours. No results for input(s): AMMONIA in the last 168 hours.  CBC:  Recent Labs Lab 12/15/2014 1702 12/07/14 0204  WBC 8.9 9.0  NEUTROABS 5.7  --   HGB 12.8 11.5*  HCT 37.5 33.0*  MCV 93.8 93.0  PLT 222 181    Cardiac Enzymes:  Recent Labs Lab 12/29/2014 1702 12/27/2014 2310 12/07/14 0204  TROPONINI 0.06* 0.12* 0.12*    Lipid Panel: No results for input(s): CHOL, TRIG, HDL, CHOLHDL, VLDL, LDLCALC in the last 168 hours.  CBG: No results for input(s): GLUCAP in the last 168 hours.  Microbiology: Results for orders placed or performed during the hospital encounter of 12/22/2014  MRSA PCR Screening     Status: None   Collection Time: 12/09/2014  9:39 PM  Result Value Ref Range Status   MRSA by PCR NEGATIVE NEGATIVE Final    Comment:        The GeneXpert MRSA Assay (FDA approved for NASAL specimens only), is one component of a comprehensive MRSA colonization surveillance program. It is not intended to diagnose MRSA  infection nor to guide or monitor treatment for MRSA infections.     Coagulation Studies:  Recent Labs  12/11/2014 2120  LABPROT 15.3*  INR 1.19    Imaging: Ct Head Wo Contrast  12/19/2014   CLINICAL DATA:  Unresponsive at home.  Agonal breathing.  EXAM: CT HEAD WITHOUT CONTRAST  TECHNIQUE: Contiguous axial images were obtained from the base of the skull through the vertex without intravenous contrast.  COMPARISON:  CT 02/06/2003  FINDINGS: No acute intracranial hemorrhage. No focal mass lesion. No CT evidence of acute infarction. No midline shift or mass effect. No hydrocephalus. Basilar cisterns are patent.  Basilar artery is dense however similar in  density to other circle of Willis vessels.  Cortical atrophy and proportional ventricular dilatation. There is periventricular white matter hypodensities. These chronic changes are are progressed from CT 2004. Paranasal sinuses and mastoid air cells are clear.  IMPRESSION: 1. No acute intracranial findings. 2. Progression of chronic findings of atrophy and microvascular disease.   Electronically Signed   By: Genevive BiStewart  Edmunds M.D.   On: 12/17/2014 18:26   Dg Chest Port 1 View  12/07/2014   CLINICAL DATA:  Patient found unresponsive and has been intubated.  EXAM: PORTABLE CHEST - 1 VIEW  COMPARISON:  01/17/2013  FINDINGS: Endotracheal tube tip lies approximately 1.5 cm above the carina. Temporary pacing pads present overlying the mid and left chest. No edema or pneumothorax identified. There is opacity at the right lung base which may represent atelectasis versus infiltrate. The heart size is normal.  IMPRESSION: Endotracheal tube tip approximately 1.5 cm above the carina. Right basilar atelectasis versus infiltrate.   Electronically Signed   By: Irish LackGlenn  Yamagata M.D.   On: 12/28/2014 17:30    Medications:  Scheduled: . antiseptic oral rinse  7 mL Mouth Rinse QID  . cefTRIAXone (ROCEPHIN)  IV  1 g Intravenous Q24H  . chlorhexidine  15 mL Mouth Rinse BID  . enoxaparin (LOVENOX) injection  30 mg Subcutaneous Q24H  . levothyroxine  100 mcg Per Tube QAC breakfast  . pantoprazole (PROTONIX) IV  40 mg Intravenous Q24H    Assessment/Plan:  81 year old female found unresponsive. Patient currently intubated and sedated. Currently off all sedation with no notable neurologic function on exam.  Head CT personally reviewed and shows no acute changes. Lab work reveals a UTI and chest x-ray a possible pneumonia.  Recommendations: 1. Would hold on MRI pending family discussion as unclear if it would change plan of care at this time 2. Continue ASA daily 3. Agree with treatment of infection 4. Will need to  have discussion with family and CCM in regards to goals of care   LOS: 1 day   Elspeth Choeter Lonya Johannesen, DO [facility] [phone]  If 7pm- 7am, please page neurology on call as listed in AMION. 12/07/2014  8:24 AM

## 2014-12-07 NOTE — Progress Notes (Signed)
  Echocardiogram 2D Echocardiogram has been performed.  Cynthia Lynn, Cynthia Lynn 12/07/2014, 4:48 PM

## 2014-12-08 ENCOUNTER — Encounter (HOSPITAL_COMMUNITY): Payer: Self-pay | Admitting: Radiology

## 2014-12-08 ENCOUNTER — Inpatient Hospital Stay (HOSPITAL_COMMUNITY): Payer: Medicare Other

## 2014-12-08 DIAGNOSIS — I639 Cerebral infarction, unspecified: Secondary | ICD-10-CM

## 2014-12-08 LAB — CBC
HEMATOCRIT: 33.1 % — AB (ref 36.0–46.0)
HEMOGLOBIN: 11.2 g/dL — AB (ref 12.0–15.0)
MCH: 31.5 pg (ref 26.0–34.0)
MCHC: 33.8 g/dL (ref 30.0–36.0)
MCV: 93.2 fL (ref 78.0–100.0)
PLATELETS: 204 10*3/uL (ref 150–400)
RBC: 3.55 MIL/uL — ABNORMAL LOW (ref 3.87–5.11)
RDW: 15.2 % (ref 11.5–15.5)
WBC: 10.3 10*3/uL (ref 4.0–10.5)

## 2014-12-08 LAB — COMPREHENSIVE METABOLIC PANEL
ALT: 31 U/L (ref 0–35)
AST: 35 U/L (ref 0–37)
Albumin: 2.8 g/dL — ABNORMAL LOW (ref 3.5–5.2)
Alkaline Phosphatase: 83 U/L (ref 39–117)
Anion gap: 9 (ref 5–15)
BUN: 14 mg/dL (ref 6–23)
CO2: 26 mmol/L (ref 19–32)
CREATININE: 0.88 mg/dL (ref 0.50–1.10)
Calcium: 8.5 mg/dL (ref 8.4–10.5)
Chloride: 102 mEq/L (ref 96–112)
GFR calc Af Amer: 62 mL/min — ABNORMAL LOW (ref >90)
GFR calc non Af Amer: 54 mL/min — ABNORMAL LOW (ref >90)
GLUCOSE: 125 mg/dL — AB (ref 70–99)
Potassium: 3.4 mmol/L — ABNORMAL LOW (ref 3.5–5.1)
Sodium: 137 mmol/L (ref 135–145)
TOTAL PROTEIN: 5.6 g/dL — AB (ref 6.0–8.3)
Total Bilirubin: 1 mg/dL (ref 0.3–1.2)

## 2014-12-08 LAB — GLUCOSE, CAPILLARY
GLUCOSE-CAPILLARY: 116 mg/dL — AB (ref 70–99)
GLUCOSE-CAPILLARY: 107 mg/dL — AB (ref 70–99)
GLUCOSE-CAPILLARY: 149 mg/dL — AB (ref 70–99)
Glucose-Capillary: 113 mg/dL — ABNORMAL HIGH (ref 70–99)
Glucose-Capillary: 147 mg/dL — ABNORMAL HIGH (ref 70–99)
Glucose-Capillary: 157 mg/dL — ABNORMAL HIGH (ref 70–99)
Glucose-Capillary: 161 mg/dL — ABNORMAL HIGH (ref 70–99)

## 2014-12-08 LAB — BLOOD GAS, ARTERIAL
Acid-Base Excess: 0.3 mmol/L (ref 0.0–2.0)
BICARBONATE: 24.2 meq/L — AB (ref 20.0–24.0)
Drawn by: 252031
FIO2: 0.4 %
LHR: 15 {breaths}/min
MECHVT: 440 mL
O2 SAT: 99.1 %
PEEP: 5 cmH2O
PH ART: 7.42 (ref 7.350–7.450)
Patient temperature: 98.6
TCO2: 25.4 mmol/L (ref 0–100)
pCO2 arterial: 38.1 mmHg (ref 35.0–45.0)
pO2, Arterial: 167 mmHg — ABNORMAL HIGH (ref 80.0–100.0)

## 2014-12-08 MED ORDER — POTASSIUM CHLORIDE 20 MEQ/15ML (10%) PO SOLN
20.0000 meq | ORAL | Status: AC
Start: 2014-12-08 — End: 2014-12-08
  Administered 2014-12-08: 20 meq
  Filled 2014-12-08: qty 15

## 2014-12-08 NOTE — Progress Notes (Signed)
[facility] FOR AM LAB REPLACEMENT ONLY  The patient does apply for the [facility] Electrolyte Replacment Protocol based on the criteria listed below:   1. Is GFR >/= 40 ml/min? Yes.    Patient's GFR today is 54 2. Is urine output >/= 0.5 ml/kg/hr for the last 6 hours? Yes.   Patient's UOP is 1.0 ml/kg/hr 3. Is BUN < 60 mg/dL? Yes.    Patient's BUN today is 14 4. Abnormal electrolyte(s): K+3.4 5. Ordered repletion with: protocol 6. If a panic level lab has been reported, has the CCM MD in charge been notified? No..   Physician:  Wyline MoodE Deterding  Latisha Lasch [facility] 12/08/2014 5:28 AM

## 2014-12-08 NOTE — Progress Notes (Signed)
PULMONARY / CRITICAL CARE MEDICINE   Name: Cynthia Lynn MRN: [id number] DOB: 14-Oct-1916    ADMISSION DATE:  12/12/2014 CONSULTATION DATE:  12/27/2014  REFERRING MD :  ED  CHIEF COMPLAINT:  Unresponsive  INITIAL PRESENTATION: 81 year old woman with history of HTN, hypothyroidism, macular degeneration presenting with acute encephalopathy.   STUDIES:  CXR 1/8 >> R basilar atelectasis versus infiltrate CT head 1/8 >> no acute intracranial findings EEG 1/9>>> CT head 1/10: Dense basilar artery highly concerning for thrombosis resulting in large brainstem, medial thalamus, LEFT cerebellar infarcts. Associated edema results in Fourth ventricle and cerebral aqueduct effacement, early obstructive hydrocephalus.   SIGNIFICANT EVENTS: 1/8 >> Intubated in ED 1/9: family discussion. Made DNR.  SUBJECTIVE: Intubated, unresponsive  VITAL SIGNS: Temp:  [98.2 F (36.8 C)-99.5 F (37.5 C)] 98.6 F (37 C) (01/10 0800) Pulse Rate:  [63-95] 65 (01/10 0800) Resp:  [9-24] 17 (01/10 0800) BP: (139-217)/(58-98) 151/61 mmHg (01/10 0800) SpO2:  [96 %-100 %] 97 % (01/10 0800) FiO2 (%):  [40 %] 40 % (01/10 0800) Weight:  [68 kg (149 lb 14.6 oz)] 68 kg (149 lb 14.6 oz) (01/10 0300) HEMODYNAMICS:   VENTILATOR SETTINGS: Vent Mode:  [-] PRVC FiO2 (%):  [40 %] 40 % Set Rate:  [15 bmp-22 bmp] 15 bmp Vt Set:  [440 mL] 440 mL PEEP:  [5 cmH20] 5 cmH20 Plateau Pressure:  [11 cmH20-18 cmH20] 18 cmH20 INTAKE / OUTPUT:  Intake/Output Summary (Last 24 hours) at 12/08/14 0919 Last data filed at 12/08/14 0800  Gross per 24 hour  Intake   2105 ml  Output   1990 ml  Net    115 ml    PHYSICAL EXAMINATION: General:  Unresponsive Neuro:  Unresponsive. Withdraws to pain on R greater than L. UE less movement than LE. Plantars upgoing bilaterally. No change  HEENT:  [place]/AT, pupils equal but fixed. Cardiovascular:  RRR Lungs:  Mechanical breath sounds bilaterally, otherwise clear  Abdomen:  Soft, mildly  distended. Musculoskeletal:  BLE 2+ edema to ankles Skin:  No lesions   LABS:  CBC  Recent Labs Lab 12/07/2014 1702 12/07/14 0204 12/08/14 0245  WBC 8.9 9.0 10.3  HGB 12.8 11.5* 11.2*  HCT 37.5 33.0* 33.1*  PLT 222 181 204   Coag's  Recent Labs Lab 12/16/2014 2120  APTT 39*  INR 1.19   BMET  Recent Labs Lab 12/02/2014 1702 12/07/14 0204 12/08/14 0245  NA 136 139 137  K 3.5 4.5 3.4*  CL 100 103 102  CO2 BUN CREATININE 1.11* 1.04 0.88  GLUCOSE 175* 108* 125*   Electrolytes  Recent Labs Lab 12/02/2014 1702 12/12/2014 2310 12/07/14 0204 12/08/14 0245  CALCIUM 9.1  --  8.7 8.5  MG  --  1.8  --   --   PHOS  --  3.0  --   --    Sepsis Markers  Recent Labs Lab 12/15/2014 1747  LATICACIDVEN 0.94   ABG  Recent Labs Lab 12/01/2014 1732 12/07/14 0301 12/08/14 0339  PHART 7.264* 7.528* 7.420  PCO2ART 71.2* 32.8* 38.1  PO2ART 387.0* 117.0* 167.0*   Liver Enzymes  Recent Labs Lab 12/08/14 0245  AST 35  ALT 31  ALKPHOS 83  BILITOT 1.0  ALBUMIN 2.8*   Cardiac Enzymes  Recent Labs Lab 12/25/2014 2310 12/07/14 0204 12/07/14 0740  TROPONINI 0.12* 0.12* 0.10*   Glucose  Recent Labs Lab 12/07/14 1533 12/07/14 1935 12/08/14 0021 12/08/14 0357 12/08/14 0801  GLUCAP 115*  116* 113* 116* 107*    Imaging CXR: RLL patchy infiltrate, ETT in place EKG: Sinus, normal rhythm, <391mm ST elevation in V1, V2, 1mm depression in V4  ASSESSMENT / PLAN:  PULMONARY ETT 1/8 >> A: Ventilator dependent hypercarbic respiratory failure, intubated for airway protection     Possible PNA  P:   PRVC, wean as tolerated VAP prevention per protocol Will d/w family think we should consider withdrawal; they are trying to come to terms with this  CARDIOVASCULAR CVL none A:  HTN Mild troponin bump. Could reflect mild demand ischemia  P:  Telemetry Home amlodpine 5mg  daily and lisinopril 80mg  daily held - will allow for permissive hypertension in  setting of ?CVA ASA daily Not a candidate for cardiac interventions   RENAL A:  AKI on CKD stage 3 P:   NS @ 75 ml/hr  GASTROINTESTINAL A:  No acute issues P:   Placed NGT Started tubefeeds   HEMATOLOGIC A:  VTE ppx P:  SCDs Lovenox daily Follow CBC  INFECTIOUS A:   UTI R basilar infiltrate - ?PNA P:   BCx2 1/8 >> UC 1/8 >> Sputum 1/8>>  Abx: ceftriaxone, start date 1/8, day 3 >>  ENDOCRINE A:   Hypothyroidism P:   Cont home synthroid 100mcg daily for now   NEUROLOGIC A:   Acute encephalopathy Acute thrombotic stroke: brainstem, medial thalamus and Left cerebellar  Early Hydrocephalus  Her MS is c/w something catastrophic  P:   RASS goal: -0 No need for EEG Supportive care Discuss goals of care w/ family   FAMILY  - Updates: Daughter and husband updated at bedside  - Inter-disciplinary family meet or Palliative Care meeting due by:  12/13/2014  NP SUMMARY  89yof, fairly independent at baseline, presents with AMS after being found unresponsive at home. CT has confirmed: Acute thrombotic stroke: brainstem, medial thalamus and Left cerebellar w/ early evidence of Hydrocephalus. Have discussed findings w/ family. We will limit further aggressive treatment. Anticipate w/d of care in next 24 hrs.   Simonne MartinetPeter E Babcock ACNP-BC [facility] Pager # [phone] OR # [phone] if no answer 12/08/2014, 9:19 AM   Attending Note:  I have examined patient, reviewed labs, studies and notes. I have discussed the case with Kreg ShropshireP Babcock, and I agree with the data and plans as amended above. Pt with CVA and hydrocephalous, severe and likely irreversible encephalopathy. Family has been informed about prognosis, still difficulty accepting this. Will need to continue to discuss goals and probable withdrawal of care. Independent critical care time is 45 minutes.   Levy Pupaobert Erroll Wilbourne, MD, PhD 12/08/2014, 6:35 PM [facility] [phone] or if no  answer [phone]

## 2014-12-08 NOTE — Progress Notes (Signed)
CCM made aware of CT results.

## 2014-12-08 NOTE — Progress Notes (Addendum)
Subjective: No overnight events. 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 Per RN patient is minimally breathing over the vent.   Objective: Current vital signs: BP 139/61 mmHg  Pulse 64  Temp(Src) 98.2 F (36.8 C) (Axillary)  Resp 24  Ht  (1.626 m)  Wt 68 kg (149 lb 14.6 oz)  BMI 25.72 kg/m2  SpO2 100% Vital signs in last 24 hours: Temp:  [98.2 F (36.8 C)-99.6 F (37.6 C)] 98.2 F (36.8 C) (01/10 0400) Pulse Rate:  [63-95] 64 (01/10 0700) Resp:  [9-24] 24 (01/10 0700) BP: (139-217)/(58-98) 139/61 mmHg (01/10 0700) SpO2:  [96 %-100 %] 100 % (01/10 0700) FiO2 (%):  [40 %] 40 % (01/10 0320) Weight:  [68 kg (149 lb 14.6 oz)] 68 kg (149 lb 14.6 oz) (01/10 0300)  Intake/Output from previous day: 01/09 0701 - 01/10 0700 In: 2140 [I.V.:1800; NG/GT:290; IV Piggyback:50] Out: 1990 [Urine:1990] Intake/Output this shift:   Nutritional status: Diet NPO time specified  Neurologic Exam: Mental Status: Patient does not respond to verbal stimuli. Extensor posturing with noxious stimuli. Does not follow commands. No verbalizations noted.  Cranial Nerves: II: patient does not respond confrontation bilaterally, pupils right 4 mm, left 3 mm,and unreactive bilaterally III,IV,VI: doll's response absent bilaterally.  V,VII: corneal reflex absent bilaterally  VIII: patient does not respond to verbal stimuli IX,X: weak to no cough reflex noted Motor: Extremities flaccid throughout. No spontaneous movement noted. No purposeful movements noted.  Sensory: Does not respond to noxious stimuli in any extremity. LE triple flexion noted Deep Tendon Reflexes:  1+ in the upper extremities, absent at the knees and 1+ at the ankles.  Plantars: upgoing bilaterally Cerebellar: Unable to perform  Lab Results: Basic Metabolic Panel:  Recent Labs Lab 15-Dec-2014 1702 2014/12/15 2310 12/07/14 0204  12/08/14 0245  NA 136  --  139 137  K 3.5  --  4.5 3.4*  CL 100  --  103 102  CO2 29  --  25 26  GLUCOSE 175*  --  108* 125*  BUN 20  --  18 14  CREATININE 1.11*  --  1.04 0.88  CALCIUM 9.1  --  8.7 8.5  MG  --  1.8  --   --   PHOS  --  3.0  --   --     Liver Function Tests:  Recent Labs Lab 12/08/14 0245  AST 35  ALT 31  ALKPHOS 83  BILITOT 1.0  PROT 5.6*  ALBUMIN 2.8*   No results for input(s): LIPASE, AMYLASE in the last 168 hours. No results for input(s): AMMONIA in the last 168 hours.  CBC:  Recent Labs Lab 2014-12-15 1702 12/07/14 0204 12/08/14 0245  WBC 8.9 9.0 10.3  NEUTROABS 5.7  --   --   HGB 12.8 11.5* 11.2*  HCT 37.5 33.0* 33.1*  MCV 93.8 93.0 93.2  PLT 222 181 204    Cardiac Enzymes:  Recent Labs Lab 12/15/14 1702 12/15/2014 2310 12/07/14 0204 12/07/14 0740  TROPONINI 0.06* 0.12* 0.12* 0.10*    Lipid Panel: No results for input(s): CHOL, TRIG, HDL, CHOLHDL, VLDL, LDLCALC in the last 168 hours.  CBG:  Recent Labs Lab 12/07/14 1533 12/07/14 1935 12/08/14 0021 12/08/14 0357  GLUCAP 115* 116* 113* 116*    Microbiology: Results for orders placed or performed during the hospital encounter of December 15, 2014  MRSA PCR Screening     Status: None   Collection  Time: January 02, 2015  9:39 PM  Result Value Ref Range Status   MRSA by PCR NEGATIVE NEGATIVE Final    Comment:        The GeneXpert MRSA Assay (FDA approved for NASAL specimens only), is one component of a comprehensive MRSA colonization surveillance program. It is not intended to diagnose MRSA infection nor to guide or monitor treatment for MRSA infections.     Coagulation Studies:  Recent Labs  01/02/2015 2120  LABPROT 15.3*  INR 1.19    Imaging: Ct Head Wo Contrast  12/08/2014   CLINICAL DATA:  Found unresponsive at home 2 days ago, follow-up evaluation.  EXAM: CT HEAD WITHOUT CONTRAST  TECHNIQUE: Contiguous axial images were obtained from the base of the skull through the  vertex without intravenous contrast.  COMPARISON:  CT of the head 2015-01-02  FINDINGS: Expansile hypodense mesial bilateral thalamus, midbrain, pons and LEFT cerebellum with expansion, partial effacement of the fourth ventricle. Dense basilar artery. Severe calcific atherosclerosis of the vertebral arteries.  Slightly increasing ventricle size, anterior recess of the third ventricle is 9 mm, previously 7 mm. Patchy supratentorial white matter hypodensities unchanged. No abnormal extra-axial fluid collections. Basal cisterns are patent  Nasogastric tube via LEFT nares. Similar paranasal sinus mucosal thickening without air-fluid levels. The mastoid air cells are well-aerated. No skull fracture.  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 Associated edema results in Fourth ventricle and cerebral aqueduct effacement, early obstructive hydrocephalus.  Acute findings discussed with and reconfirmed by Earlean Shawl, RN in Neuro ICU on 12/08/2014 at 6:10 am.   Electronically Signed   By: Awilda Metro   On: 12/08/2014 06:14   Ct Head Wo Contrast  01-02-15   CLINICAL DATA:  Unresponsive at home.  Agonal breathing.  EXAM: CT HEAD WITHOUT CONTRAST  TECHNIQUE: Contiguous axial images were obtained from the base of the skull through the vertex without intravenous contrast.  COMPARISON:  CT 02/06/2003  FINDINGS: No acute intracranial hemorrhage. No focal mass lesion. No CT evidence of acute infarction. No midline shift or mass effect. No hydrocephalus. Basilar cisterns are patent.  Basilar artery is dense however similar in density to other circle of Willis vessels.  Cortical atrophy and proportional ventricular dilatation. There is periventricular white matter hypodensities. These chronic changes are are progressed from CT 2004. Paranasal sinuses and mastoid air cells are clear.  IMPRESSION: 1. No acute intracranial findings. 2.  Progression of chronic findings of atrophy and microvascular disease.   Electronically Signed   By: Genevive Bi M.D.   On: 2015-01-02 18:26   Dg Chest Port 1 View  2015/01/02   CLINICAL DATA:  Patient found unresponsive and has been intubated.  EXAM: PORTABLE CHEST - 1 VIEW  COMPARISON:  01/17/2013  FINDINGS: Endotracheal tube tip lies approximately 1.5 cm above the carina. Temporary pacing pads present overlying the mid and left chest. No edema or pneumothorax identified. There is opacity at the right lung base which may represent atelectasis versus infiltrate. The heart size is normal.  IMPRESSION: Endotracheal tube tip approximately 1.5 cm above the carina. Right basilar atelectasis versus infiltrate.   Electronically Signed   By: Irish Lack M.D.   On: 02-Jan-2015 17:30   Dg Abd Portable 1v  12/07/2014   CLINICAL DATA:  81 year old female status post feeding tube placement.  EXAM: PORTABLE ABDOMEN - 1 VIEW  COMPARISON:  No priors.  FINDINGS: Metallic tipped feeding tube in position  with tip in the proximal stomach. Gas and stool are noted throughout the colon and rectum. No pathologic dilatation of small bowel in the visualized portions of the abdomen. Lower pelvis is incompletely visualized. Extensive atherosclerosis.  IMPRESSION: 1. Tip of feeding tube is in the proximal stomach.   Electronically Signed   By: Trudie Reedaniel  Entrikin M.D.   On: 12/07/2014 15:52    Medications:  Scheduled: . antiseptic oral rinse  7 mL Mouth Rinse QID  . cefTRIAXone (ROCEPHIN)  IV  1 g Intravenous Q24H  . chlorhexidine  15 mL Mouth Rinse BID  . enoxaparin (LOVENOX) injection  30 mg Subcutaneous Q24H  . levothyroxine  100 mcg Per Tube QAC breakfast  . pantoprazole (PROTONIX) IV  40 mg Intravenous Q24H  . potassium chloride  20 mEq Per Tube Q4H    Assessment/Plan:  81 year old female found unresponsive. Patient currently intubated and sedated. Currently off all sedation with no notable neurologic function on  exam.  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  Discussed findings with CCM. Based on clinical exam and CT findings there is poor prognosis for a meaningful recovery. Discussed CT findings with family at bedside. Counseled them that this appears to be a devastating stroke and there is a poor prognosis for any change of meaningful recovery. They expressed understanding. No further aggressive therapy at this time. They wish to have more time in regards to making a decision on withdrawal of care. No further neurological workup at this time. Please call with further questions.   LOS: 2 days   Elspeth Choeter Eliany Mccarter, DO [facility] [phone]  If 7pm- 7am, please page neurology on call as listed in AMION. 12/08/2014  7:49 AM

## 2014-12-09 DIAGNOSIS — I6300 Cerebral infarction due to thrombosis of unspecified precerebral artery: Secondary | ICD-10-CM

## 2014-12-09 LAB — GLUCOSE, CAPILLARY
GLUCOSE-CAPILLARY: 134 mg/dL — AB (ref 70–99)
GLUCOSE-CAPILLARY: 149 mg/dL — AB (ref 70–99)

## 2014-12-09 MED ORDER — MORPHINE SULFATE 2 MG/ML IJ SOLN
1.0000 mg | INTRAMUSCULAR | Status: DC | PRN
Start: 2014-12-09 — End: 2014-12-10

## 2014-12-09 NOTE — Clinical Social Work Note (Signed)
Clinical Social Work Department BRIEF PSYCHOSOCIAL ASSESSMENT 12/09/2014  Patient:  Cynthia Lynn, Cynthia Lynn     Account Number:  [id number]     Admit date:  12/22/2014  Clinical Social Worker:  Myles Lipps  Date/Time:  12/09/2014 11:45 AM  Referred by:  RN  Date Referred:  12/09/2014 Referred for  Other - See comment   Other Referral:   Placement concerns for patient husband prior to patient withdraw   Interview type:  Family Other interview type:   Spoke with patient daughter at bedside    PSYCHOSOCIAL DATA Living Status:  FAMILY Admitted from facility:   Level of care:   Primary support name:  Cynthia Lynn [phone] Primary support relationship to patient:  CHILD, ADULT Degree of support available:   Strong    CURRENT CONCERNS Current Concerns  Adjustment to Illness  Other - See comment   Other Concerns:   Possible end of life support    SOCIAL WORK ASSESSMENT / PLAN Clinical Social Worker met with patient daughter at bedside to offer support and discuss patient husband needs per patient daughter request.  Patient daughter states that patient and her husband have been living with her and her family in a connected type home for several years now. Patient and husband were physically capable of managing on their own up until hospital admission.  Patient was primary caregiver to her husband due to his diagnosis of Alzheimer's and constant confusion.  Patient daughter states that she has been doing the shopping and taking care of errands for several years.    Patient daughter hints at the idea of withdraw of patient, however not willing to fully verbalize her wishes and/or patient wishes.  Patient daughter is concerned that patient husband cannot manage on his own at the house while her and her family are working and has requested resources prior to possible withdraw of patient.  CSW explained the process to patient daughter who is agreeable and willing for all resources.  CSW  provided patient daughter with [facility] contact information (patient husband is a English as a second language teacher) and Careers information officer.  Patient daughter states that she is unaware of her parents current financial situation and needs to do some research prior to committing to potential facility placement.  CSW remains available for support as needed for end of life,  but will sign off on current consult.  Please reconsult if new needs arise.   Assessment/plan status:  Psychosocial Support/Ongoing Assessment of Needs Other assessment/ plan:   Information/referral to community resources:   Holiday representative provided patient daughter with private duty Advertising copywriter and [place] contacts for patient husband.  CSW also offered funeral home resources, however patient daughter declined.    PATIENT'S/FAMILY'S RESPONSE TO PLAN OF CARE: Patient currently intubated and sedated with family at bedside.  Patient family realistic regarding patient long term prognosis and preparing for needs for patient husband when patient passes.  Patient family appropriately grieving patient loss, but seemed prepared for the days to come. Patient daughter verbalized her appreciation for CSW support and involvement.

## 2014-12-09 NOTE — Progress Notes (Signed)
Chaplain introduced herself to pt son, Reita ClicheBobby, and daughter-in-law, Ronnald CollumJo Anne. Pt family told chaplain of pt faith. Pt son stated "she may already be there [in heaven]." Chaplain offered prayer over pt. Chaplain informed pt family of her services should they need them.    12/09/14 1100  Clinical Encounter Type  Visited With Family  Visit Type Initial;Spiritual support  Spiritual Encounters  Spiritual Needs Emotional;Prayer  Stress Factors  Family Stress Factors Major life changes  Ivi Griffith, Mayer MaskerCourtney F, Chaplain 12/09/2014 11:09 AM

## 2014-12-09 NOTE — Progress Notes (Signed)
UR completed.  Jonaya Freshour, RN BSN MHA CCM Trauma/Neuro ICU Case Manager [phone]  

## 2014-12-09 NOTE — Progress Notes (Signed)
PULMONARY / CRITICAL CARE MEDICINE   Name: Cynthia Lynn MRN: [id number] DOB: June 30, 1916    ADMISSION DATE:  12/18/2014 CONSULTATION DATE:  12/27/2014  REFERRING MD :  ED  CHIEF COMPLAINT:  Unresponsive  INITIAL PRESENTATION: 81 year old woman with history of HTN, hypothyroidism, macular degeneration presenting with acute encephalopathy.   STUDIES:  CXR 1/8 >> R basilar atelectasis versus infiltrate CT head 1/8 >> no acute intracranial findings EEG 1/9>>> CT head 1/10: Dense basilar artery highly concerning for thrombosis resulting in large brainstem, medial thalamus, LEFT cerebellar infarcts. Associated edema results in Fourth ventricle and cerebral aqueduct effacement, early obstructive hydrocephalus.  SIGNIFICANT EVENTS: 1/8 >> Intubated in ED 1/9: family discussion. Made DNR.  SUBJECTIVE: Intubated, unresponsive  VITAL SIGNS: Temp:  [98.7 F (37.1 C)-101.7 F (38.7 C)] 101.7 F (38.7 C) (01/11 0800) Pulse Rate:  [68-98] 70 (01/11 1000) Resp:  [8-19] 16 (01/11 1000) BP: (111-187)/(43-83) 118/47 mmHg (01/11 1000) SpO2:  [96 %-100 %] 98 % (01/11 1000) FiO2 (%):  [30 %-40 %] 30 % (01/11 0850) Weight:  [70.6 kg (155 lb 10.3 oz)] 70.6 kg (155 lb 10.3 oz) (01/11 0300)  HEMODYNAMICS:   VENTILATOR SETTINGS: Vent Mode:  [-] PRVC FiO2 (%):  [30 %-40 %] 30 % Set Rate:  [15 bmp] 15 bmp Vt Set:  [440 mL] 440 mL PEEP:  [5 cmH20] 5 cmH20 Plateau Pressure:  [16 cmH20-19 cmH20] 16 cmH20  INTAKE / OUTPUT:  Intake/Output Summary (Last 24 hours) at 12/09/14 1148 Last data filed at 12/09/14 1000  Gross per 24 hour  Intake   2875 ml  Output   1695 ml  Net   1180 ml   PHYSICAL EXAMINATION: General:  Unresponsive Neuro:  Unresponsive. Posturing, not following commands. HEENT:  [place]/AT, pupils equal but fixed. Cardiovascular:  RRR Lungs:  Mechanical breath sounds bilaterally, otherwise clear  Abdomen:  Soft, mildly distended. Musculoskeletal:  BLE 2+ edema to ankles Skin:  No  lesions  LABS:  CBC  Recent Labs Lab 12/29/2014 1702 12/07/14 0204 12/08/14 0245  WBC 8.9 9.0 10.3  HGB 12.8 11.5* 11.2*  HCT 37.5 33.0* 33.1*  PLT 222 181 204   Coag's  Recent Labs Lab 12/24/2014 2120  APTT 39*  INR 1.19   BMET  Recent Labs Lab 12/13/2014 1702 12/07/14 0204 12/08/14 0245  NA 136 139 137  K 3.5 4.5 3.4*  CL 100 103 102  CO2 BUN CREATININE 1.11* 1.04 0.88  GLUCOSE 175* 108* 125*   Electrolytes  Recent Labs Lab 12/14/2014 1702 12/09/2014 2310 12/07/14 0204 12/08/14 0245  CALCIUM 9.1  --  8.7 8.5  MG  --  1.8  --   --   PHOS  --  3.0  --   --    Sepsis Markers  Recent Labs Lab 12/07/2014 1747  LATICACIDVEN 0.94   ABG  Recent Labs Lab 12/29/2014 1732 12/07/14 0301 12/08/14 0339  PHART 7.264* 7.528* 7.420  PCO2ART 71.2* 32.8* 38.1  PO2ART 387.0* 117.0* 167.0*   Liver Enzymes  Recent Labs Lab 12/08/14 0245  AST 35  ALT 31  ALKPHOS 83  BILITOT 1.0  ALBUMIN 2.8*   Cardiac Enzymes  Recent Labs Lab 12/09/2014 2310 12/07/14 0204 12/07/14 0740  TROPONINI 0.12* 0.12* 0.10*   Glucose  Recent Labs Lab 12/08/14 0801 12/08/14 1237 12/08/14 1529 12/08/14 2014 12/08/14 2348 12/09/14 0345  GLUCAP 107* 147* 149* 157* 161* 134*    Imaging CXR: RLL patchy infiltrate, ETT  in place EKG: Sinus, normal rhythm, <421mm ST elevation in V1, V2, 1mm depression in V4  ASSESSMENT / PLAN:  PULMONARY ETT 1/8 >> A: Ventilator dependent hypercarbic respiratory failure, intubated for airway protection     Possible PNA  P:   Continue full vent support. Will likely withdraw in AM. No further escalation of care.  CARDIOVASCULAR CVL none A:  HTN Mild troponin bump. Could reflect mild demand ischemia  P:  Telemetry Home amlodpine 5mg  daily and lisinopril 80mg  daily held - will allow for permissive hypertension in setting of ?CVA ASA daily Not a candidate for cardiac interventions  DNR  RENAL A:  AKI on CKD stage  3 P:   NS @ 75 ml/hr No further blood draws.  GASTROINTESTINAL A:  No acute issues P:   Placed NGT Tubefeeds   HEMATOLOGIC A:  VTE ppx P:  SCDs Lovenox daily Follow CBC  INFECTIOUS A:   UTI R basilar infiltrate - ?PNA P:   BCx2 1/8 >> UC 1/8 >> Sputum 1/8>>  Abx: ceftriaxone, start date 1/8, day 4 >> will d/c, withdrawal in AM.  ENDOCRINE A:   Hypothyroidism P:   Cont home synthroid 100mcg daily for now   NEUROLOGIC A:   Acute encephalopathy Acute thrombotic stroke: brainstem, medial thalamus and Left cerebellar  Early Hydrocephalus  Her MS is c/w something catastrophic  P:   RASS goal: -0 No need for EEG Supportive care Discuss goals of care w/ family, will continue as DNR, start morphine for comfort and likely withdrawal in AM once the rest of the family is here.   FAMILY  - Updates: Family updated bedside.  Withdrawal in AM.  - Inter-disciplinary family meet or Palliative Care meeting due by:  12/13/2014  The patient is critically ill with multiple organ systems failure and requires high complexity decision making for assessment and support, frequent evaluation and titration of therapies, application of advanced monitoring technologies and extensive interpretation of multiple databases.   Critical Care Time devoted to patient care services described in this note is  35  Minutes. This time reflects time of care of this signee Dr Koren BoundWesam Zuzanna Maroney. This critical care time does not reflect procedure time, or teaching time or supervisory time of PA/NP/Med student/Med Resident etc but could involve care discussion time.  Alyson ReedyWesam G. Zaviyar Rahal, M.D. [facility] Pulmonary/Critical Care Medicine. Pager: [phone]. After hours pager: [phone].

## 2014-12-10 LAB — URINE CULTURE

## 2014-12-10 LAB — RESPIRATORY VIRUS PANEL
Adenovirus: NOT DETECTED
INFLUENZA A: NOT DETECTED
INFLUENZA B 1: NOT DETECTED
Influenza A H1: NOT DETECTED
Influenza A H3: NOT DETECTED
Metapneumovirus: NOT DETECTED
PARAINFLUENZA 2 A: NOT DETECTED
Parainfluenza 1: NOT DETECTED
Parainfluenza 3: NOT DETECTED
RESPIRATORY SYNCYTIAL VIRUS B: NOT DETECTED
Respiratory Syncytial Virus A: NOT DETECTED
Rhinovirus: NOT DETECTED

## 2014-12-10 MED ORDER — MORPHINE BOLUS VIA INFUSION
5.0000 mg | INTRAVENOUS | Status: DC | PRN
  Filled 2014-12-10: qty 20

## 2014-12-10 MED ORDER — MORPHINE SULFATE 25 MG/ML IV SOLN
10.0000 mg/h | INTRAVENOUS | Status: DC
  Administered 2014-12-10: 5 mg/h via INTRAVENOUS
  Filled 2014-12-10: qty 10

## 2014-12-13 LAB — CULTURE, BLOOD (ROUTINE X 2)
Culture: NO GROWTH
Culture: NO GROWTH

## 2014-12-30 NOTE — Progress Notes (Signed)
Wasted 210cc of morphine gtt with Harlan StainsErika Brooks, RN

## 2014-12-30 NOTE — Progress Notes (Signed)
PT terminal extubation

## 2014-12-30 NOTE — Progress Notes (Signed)
PULMONARY / CRITICAL CARE MEDICINE   Name: Cynthia Lynn MRN: [id number] DOB: [birth year]    ADMISSION DATE:  12/05/2014 CONSULTATION DATE:  12/13/2014  REFERRING MD :  ED  CHIEF COMPLAINT:  Unresponsive  INITIAL PRESENTATION: 81 year old woman with history of HTN, hypothyroidism, macular degeneration presenting with acute encephalopathy.   STUDIES:  CXR 1/8 >> R basilar atelectasis versus infiltrate CT head 1/8 >> no acute intracranial findings EEG 1/9>>> CT head 1/10: Dense basilar artery highly concerning for thrombosis resulting in large brainstem, medial thalamus, LEFT cerebellar infarcts. Associated edema results in Fourth ventricle and cerebral aqueduct effacement, early obstructive hydrocephalus.  SIGNIFICANT EVENTS: 1/8 >> Intubated in ED 1/9: family discussion. Made DNR.  SUBJECTIVE: Intubated, unresponsive  VITAL SIGNS: Temp:  [99.9 F (37.7 C)-100.8 F (38.2 C)] 100.5 F (38.1 C) (01/12 0341) Pulse Rate:  [65-88] 71 (01/12 0910) Resp:  [13-20] 16 (01/12 0910) BP: (102-187)/(37-76) 120/47 mmHg (01/12 0800) SpO2:  [97 %-100 %] 97 % (01/12 0910) FiO2 (%):  [30 %] 30 % (01/12 0910) Weight:  [73 kg (160 lb 15 oz)] 73 kg (160 lb 15 oz) (01/12 0500)  HEMODYNAMICS:   VENTILATOR SETTINGS: Vent Mode:  [-] PRVC FiO2 (%):  [30 %] 30 % Set Rate:  [15 bmp] 15 bmp Vt Set:  [440 mL] 440 mL PEEP:  [5 cmH20] 5 cmH20 Plateau Pressure:  [15 cmH20-16 cmH20] 15 cmH20  INTAKE / OUTPUT:  Intake/Output Summary (Last 24 hours) at 02-27-15 1116 Last data filed at 02-27-15 0800  Gross per 24 hour  Intake   2625 ml  Output    785 ml  Net   1840 ml   PHYSICAL EXAMINATION: General:  Unresponsive Neuro:  Unresponsive. Posturing, not following commands. HEENT:  [place]/AT, pupils equal but fixed. Cardiovascular:  RRR Lungs:  Mechanical breath sounds bilaterally, otherwise clear  Abdomen:  Soft, mildly distended. Musculoskeletal:  BLE 2+ edema to ankles Skin:  No  lesions  LABS:  CBC  Recent Labs Lab 12/17/2014 1702 12/07/14 0204 12/08/14 0245  WBC 8.9 9.0 10.3  HGB 12.8 11.5* 11.2*  HCT 37.5 33.0* 33.1*  PLT 222 181 204   Coag's  Recent Labs Lab 12/12/2014 2120  APTT 39*  INR 1.19   BMET  Recent Labs Lab 12/07/2014 1702 12/07/14 0204 12/08/14 0245  NA 136 139 137  K 3.5 4.5 3.4*  CL 100 103 102  CO2 29 25 26   BUN 20 18 14   CREATININE 1.11* 1.04 0.88  GLUCOSE 175* 108* 125*   Electrolytes  Recent Labs Lab 12/27/2014 1702 12/20/2014 2310 12/07/14 0204 12/08/14 0245  CALCIUM 9.1  --  8.7 8.5  MG  --  1.8  --   --   PHOS  --  3.0  --   --    Sepsis Markers  Recent Labs Lab 12/16/2014 1747  LATICACIDVEN 0.94   ABG  Recent Labs Lab 12/18/2014 1732 12/07/14 0301 12/08/14 0339  PHART 7.264* 7.528* 7.420  PCO2ART 71.2* 32.8* 38.1  PO2ART 387.0* 117.0* 167.0*   Liver Enzymes  Recent Labs Lab 12/08/14 0245  AST 35  ALT 31  ALKPHOS 83  BILITOT 1.0  ALBUMIN 2.8*   Cardiac Enzymes  Recent Labs Lab 12/01/2014 2310 12/07/14 0204 12/07/14 0740  TROPONINI 0.12* 0.12* 0.10*   Glucose  Recent Labs Lab 12/08/14 1237 12/08/14 1529 12/08/14 2014 12/08/14 2348 12/09/14 0345 12/09/14 0759  GLUCAP 147* 149* 157* 161* 134* 149*    Imaging CXR: RLL patchy infiltrate, ETT in  place EKG: Sinus, normal rhythm, <69mm ST elevation in V1, V2, 1mm depression in V4  ASSESSMENT / PLAN:  PULMONARY ETT 1/8 >> A: Ventilator dependent hypercarbic respiratory failure, intubated for airway protection     Possible PNA  P:   Terminal extubation today.  CARDIOVASCULAR CVL none A:  HTN Mild troponin bump. Could reflect mild demand ischemia  P:  DNR Terminal wean.  RENAL A:  AKI on CKD stage 3 P:   D/C further blood draws.  GASTROINTESTINAL A:  No acute issues P:   D/C NGT and TF.  HEMATOLOGIC A:  VTE ppx P:  D/C lovenox.  INFECTIOUS A:   UTI R basilar infiltrate - ?PNA P:   BCx2 1/8 >> UC 1/8  >> Sputum 1/8>>  D/C abx.  ENDOCRINE A:   Hypothyroidism P:   D/C synthroid.  NEUROLOGIC A:   Acute encephalopathy Acute thrombotic stroke: brainstem, medial thalamus and Left cerebellar  Early Hydrocephalus  Her MS is c/w something catastrophic  P:   Morphine for comfort. Terminal extubation when family is ready, orders on the chart.   FAMILY  - Updates: Family updated bedside.  Terminal withdrawal orders in place.  Spoke with son and niece, ready for terminal extubation but would like daughter to be here prior to extubation.  The patient is critically ill with multiple organ systems failure and requires high complexity decision making for assessment and support, frequent evaluation and titration of therapies, application of advanced monitoring technologies and extensive interpretation of multiple databases.   Critical Care Time devoted to patient care services described in this note is  35  Minutes. This time reflects time of care of this signee Dr Koren Bound. This critical care time does not reflect procedure time, or teaching time or supervisory time of PA/NP/Med student/Med Resident etc but could involve care discussion time.  Alyson Reedy, M.D. [facility] Pulmonary/Critical Care Medicine. Pager: [phone]. After hours pager: [phone].

## 2014-12-30 NOTE — Progress Notes (Signed)
Chaplain responded to spiritual care consult for end of life. Chaplain offered emotional support, prayer, and grief support. Chaplain informed family of chaplain services should they need them. Chaplain communicated with pt nurse about chaplain follow up. Pt family seems to be supporting each other at this time. Page chaplain as needed.    September 29, 2015 0900  Clinical Encounter Type  Visited With Family;Health care provider  Visit Type Follow-up;Spiritual support  Spiritual Encounters  Spiritual Needs Emotional;Grief support;Prayer;[facility]  Stress Factors  Family Stress Factors Loss  Cynthia Lynn, Cynthia Lynn, Chaplain 08-03-15 9:37 AM

## 2014-12-30 NOTE — Progress Notes (Signed)
Pt. Pronounced dead at 1118. Two RN's Thomes CakeMari Bettylou Frew, and Pete GlatterKimberly Ledbetter verified lack of heartbeat. Family at bedside.

## 2014-12-30 DEATH — deceased

## 2015-01-08 NOTE — Discharge Summary (Addendum)
NAMSheliah Plane:  Santino, Margurite                  ACCOUNT NO.:  [id number]  MEDICAL RECORD NO.:  [id number]  LOCATION:                                FACILITY:  [facility]  PHYSICIAN:  Felipa EvenerWesam Jake Yacoub, MD  DATE OF BIRTH:  Oct 24, 1916  DATE OF ADMISSION:  12/29/2014 DATE OF DISCHARGE:  Jan 13, 2015                              DISCHARGE SUMMARY   DEATH SUMMARY  PRIMARY DIAGNOSIS/CAUSE OF DEATH:  Metabolic encephalopathy.  SECONDARY DIAGNOSES:  Acute respiratory failure, elevated troponins, comatose, acute kidney injury, right-sided pneumonia, hypothyroidism, and acute thrombotic stroke of the brainstem, medial thalamus, and left cerebellum, and hydrocephalus.  HOSPITAL COURSE:  The patient is a 81 year old female, who presented to hospital with acute mental status changes, found to have an acute thrombotic stroke.  The patient was intubated for airway protection and was maintained for 4 days in the ventilator.  After the patient failed to show any signs of improvement, family meeting took place and then decision was made to terminally extubate the patient, this would not be consistent with her wishes, at which point morphine was started and the patient was extubated, the patient expired shortly thereafter with the family at bedside.     Felipa EvenerWesam Jake Yacoub, MD     WJY/MEDQ  D:  01/06/2015  T:  01/07/2015  Job:  [id number]

## 2016-05-18 IMAGING — CT CT HEAD W/O CM
1 series · 16 of 30 positions shown, 20 images · non-contrast
Comparison: CT 02/06/2003

CLINICAL DATA: Unresponsive at home.  Agonal breathing.

EXAM:
CT HEAD WITHOUT CONTRAST
TECHNIQUE: Contiguous axial images were obtained from the base of the skull
through the vertex without intravenous contrast.

[Series 2: head 5.0 h30s · axial · 0.44mm/px · z∈[+1639,+1779]mm · 16 of 32 slices shown, 20 images]
[im 2/32  brain]
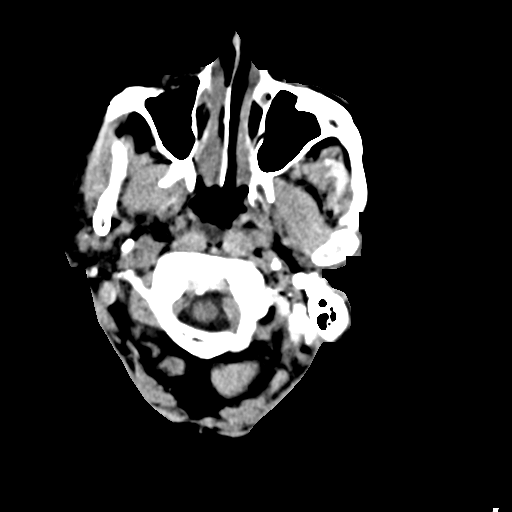
[im 2/32  bone]
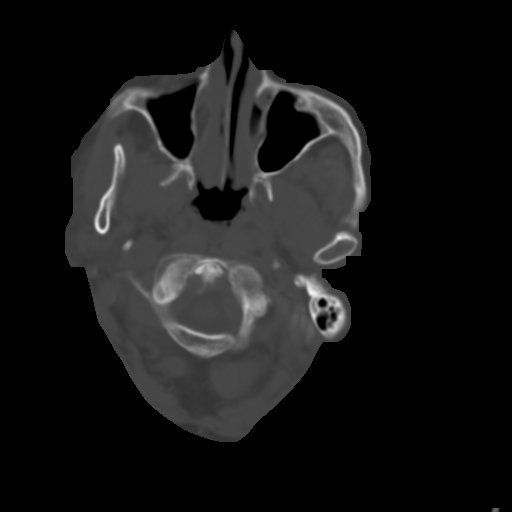
[im 4/32  brain]
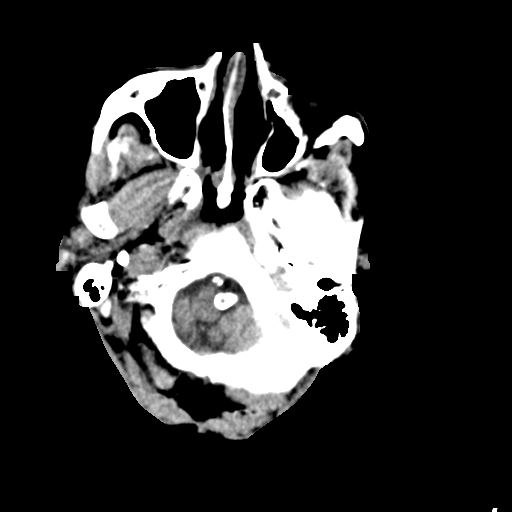
[im 6/32  brain]
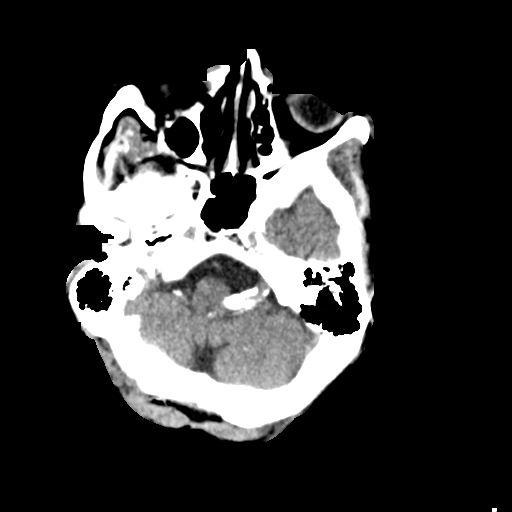
[im 8/32  brain]
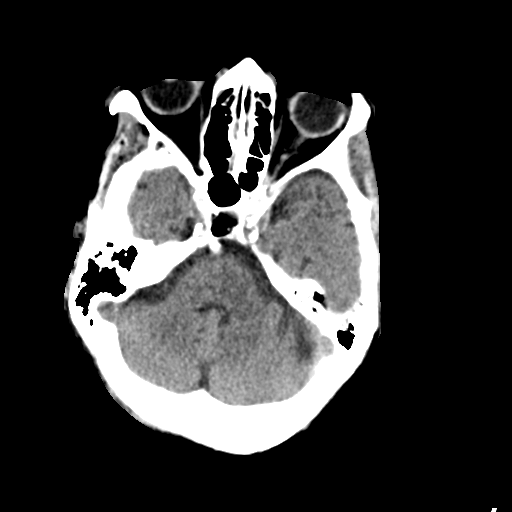
[im 9/32  brain]
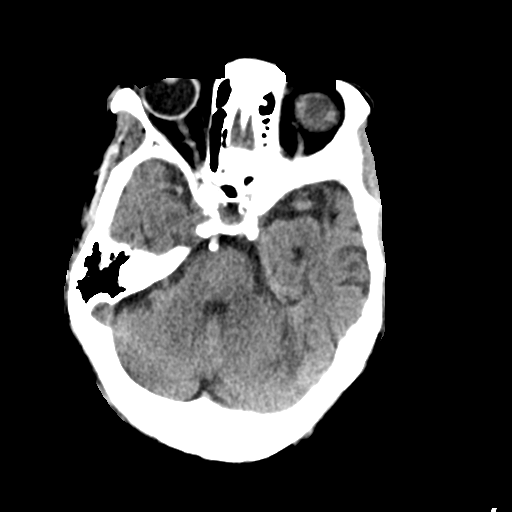
[im 9/32  bone]
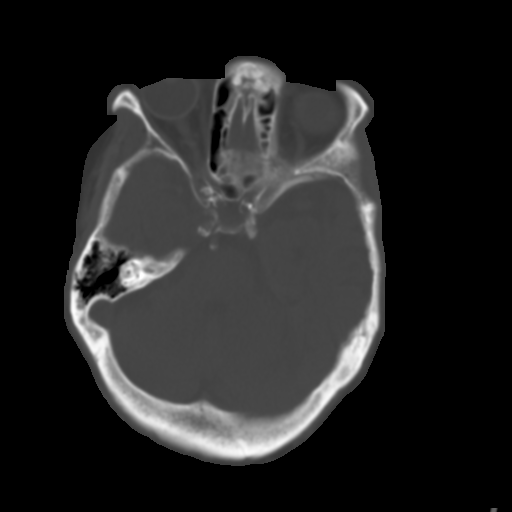
[im 11/32  brain]
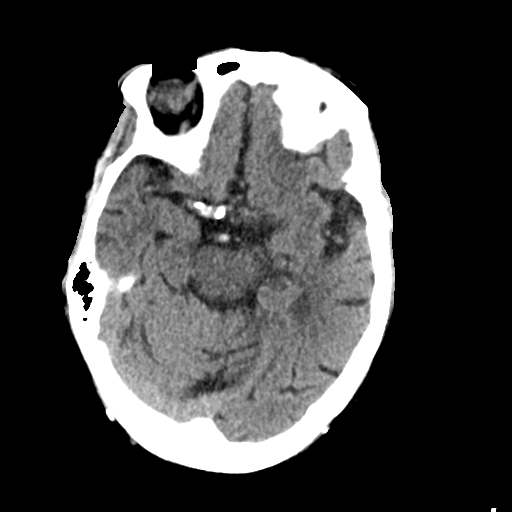
[im 13/32  brain]
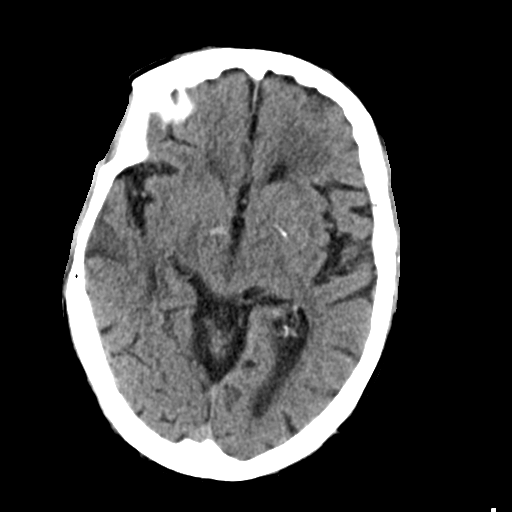
[im 15/32  brain]
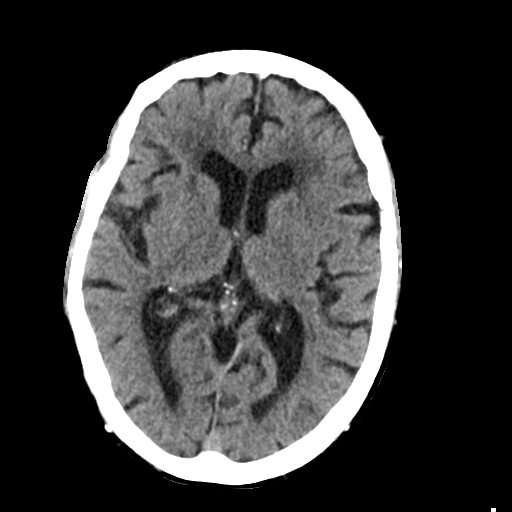
[im 17/32  brain]
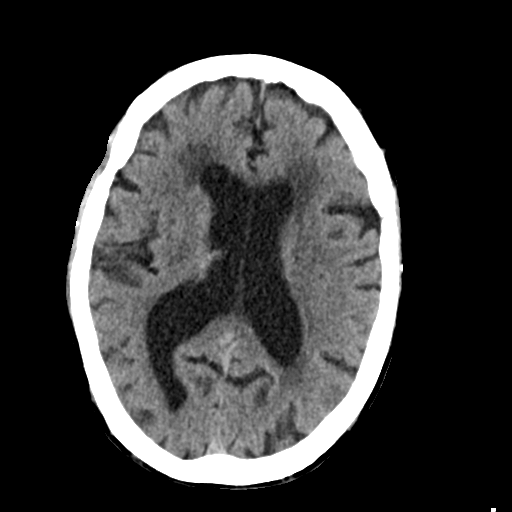
[im 17/32  bone]
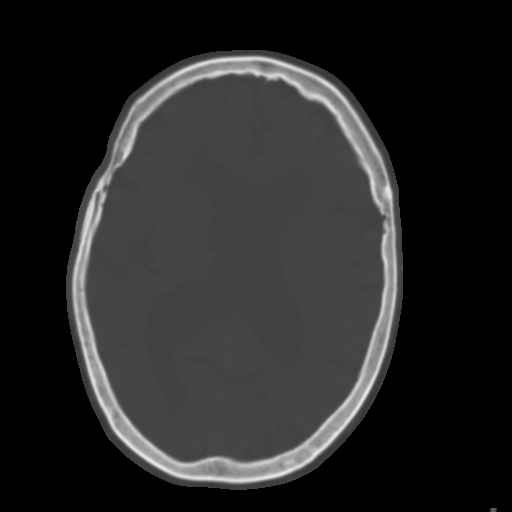
[im 19/32  brain]
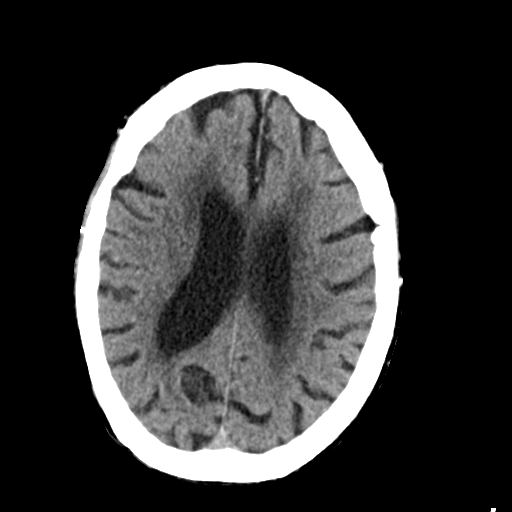
[im 21/32  brain]
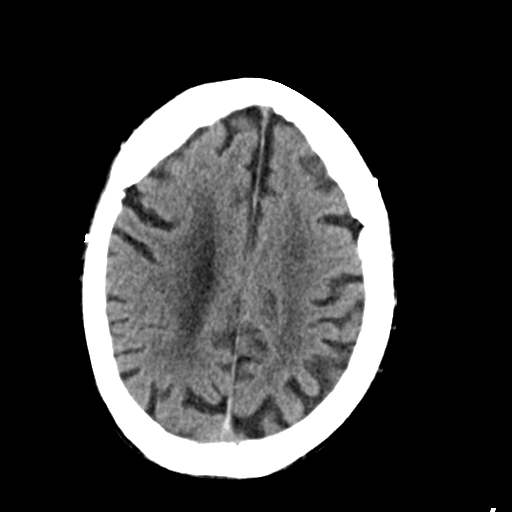
[im 23/32  brain]
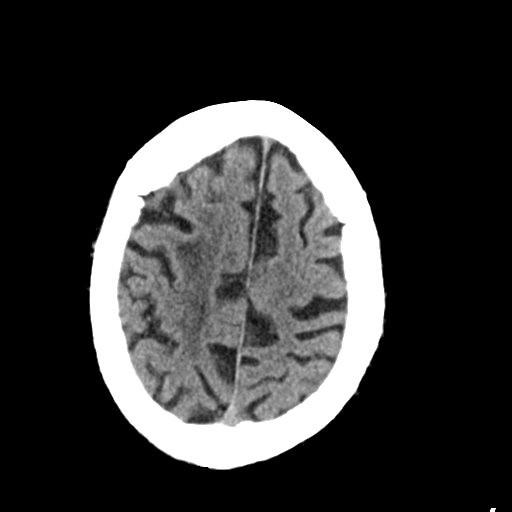
[im 24/32  brain]
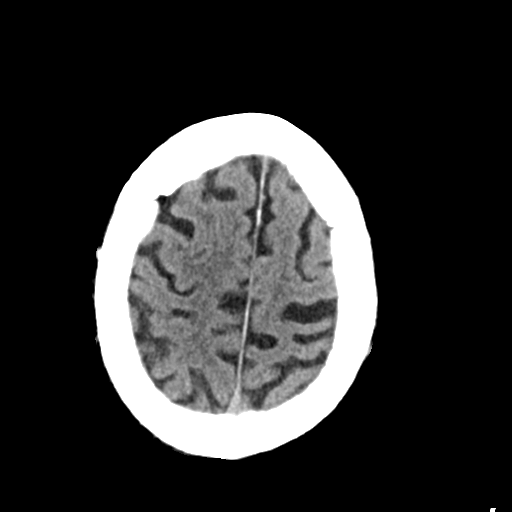
[im 24/32  bone]
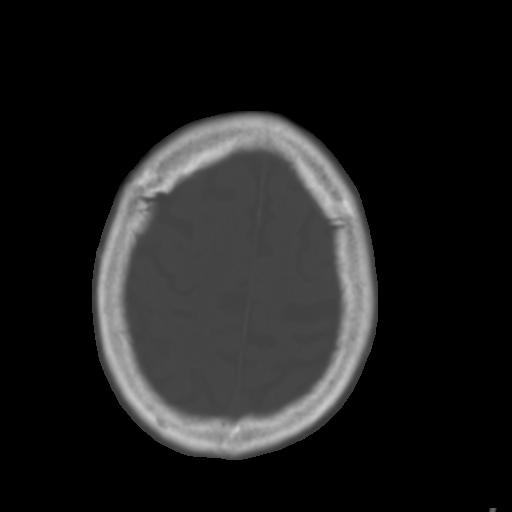
[im 26/32  brain]
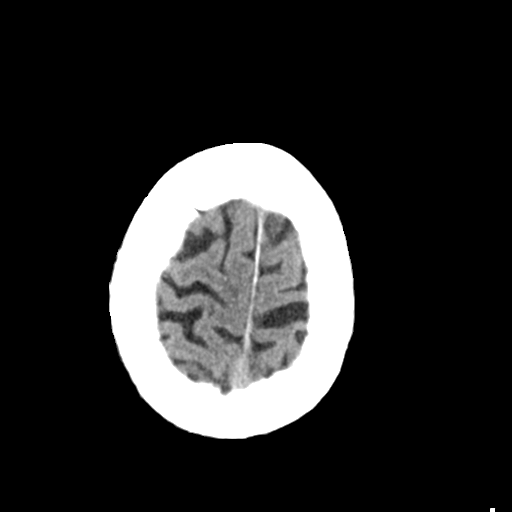
[im 28/32  brain]
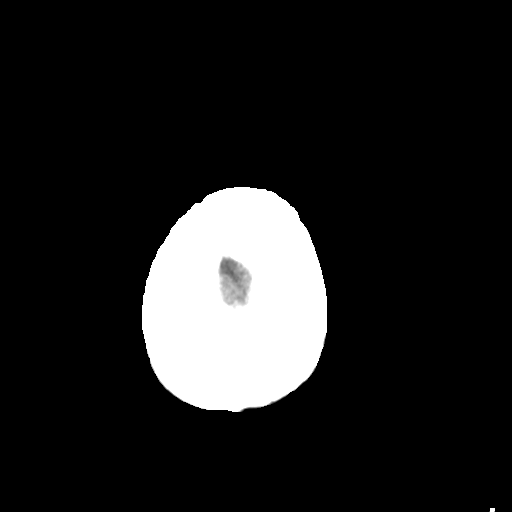
[im 30/32  brain]
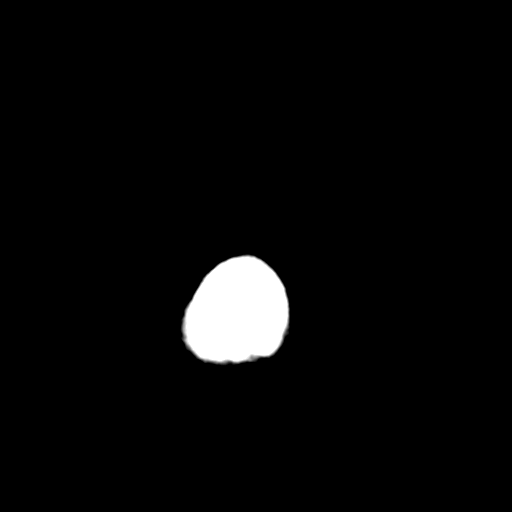

[16 of 30 positions shown; findings below may reference images not displayed]

FINDINGS: No acute intracranial hemorrhage. No focal mass lesion. No CT
evidence of acute infarction. No midline shift or mass effect. No
hydrocephalus. Basilar cisterns are patent.

Basilar artery is dense however similar in density to other circle
of Willis vessels.

Cortical atrophy and proportional ventricular dilatation. There is
periventricular white matter hypodensities. These chronic changes
are are progressed from CT 6443. Paranasal sinuses and mastoid air
cells are clear.
IMPRESSION: 1. No acute intracranial findings.
2. Progression of chronic findings of atrophy and microvascular
disease.

## 2016-05-20 IMAGING — CR DG CHEST 1V PORT
1 series · 1 of 1 positions shown · non-contrast
Comparison: 12/06/2014

CLINICAL DATA: Acute respiratory failure

EXAM:
PORTABLE CHEST - 1 VIEW

[AP]
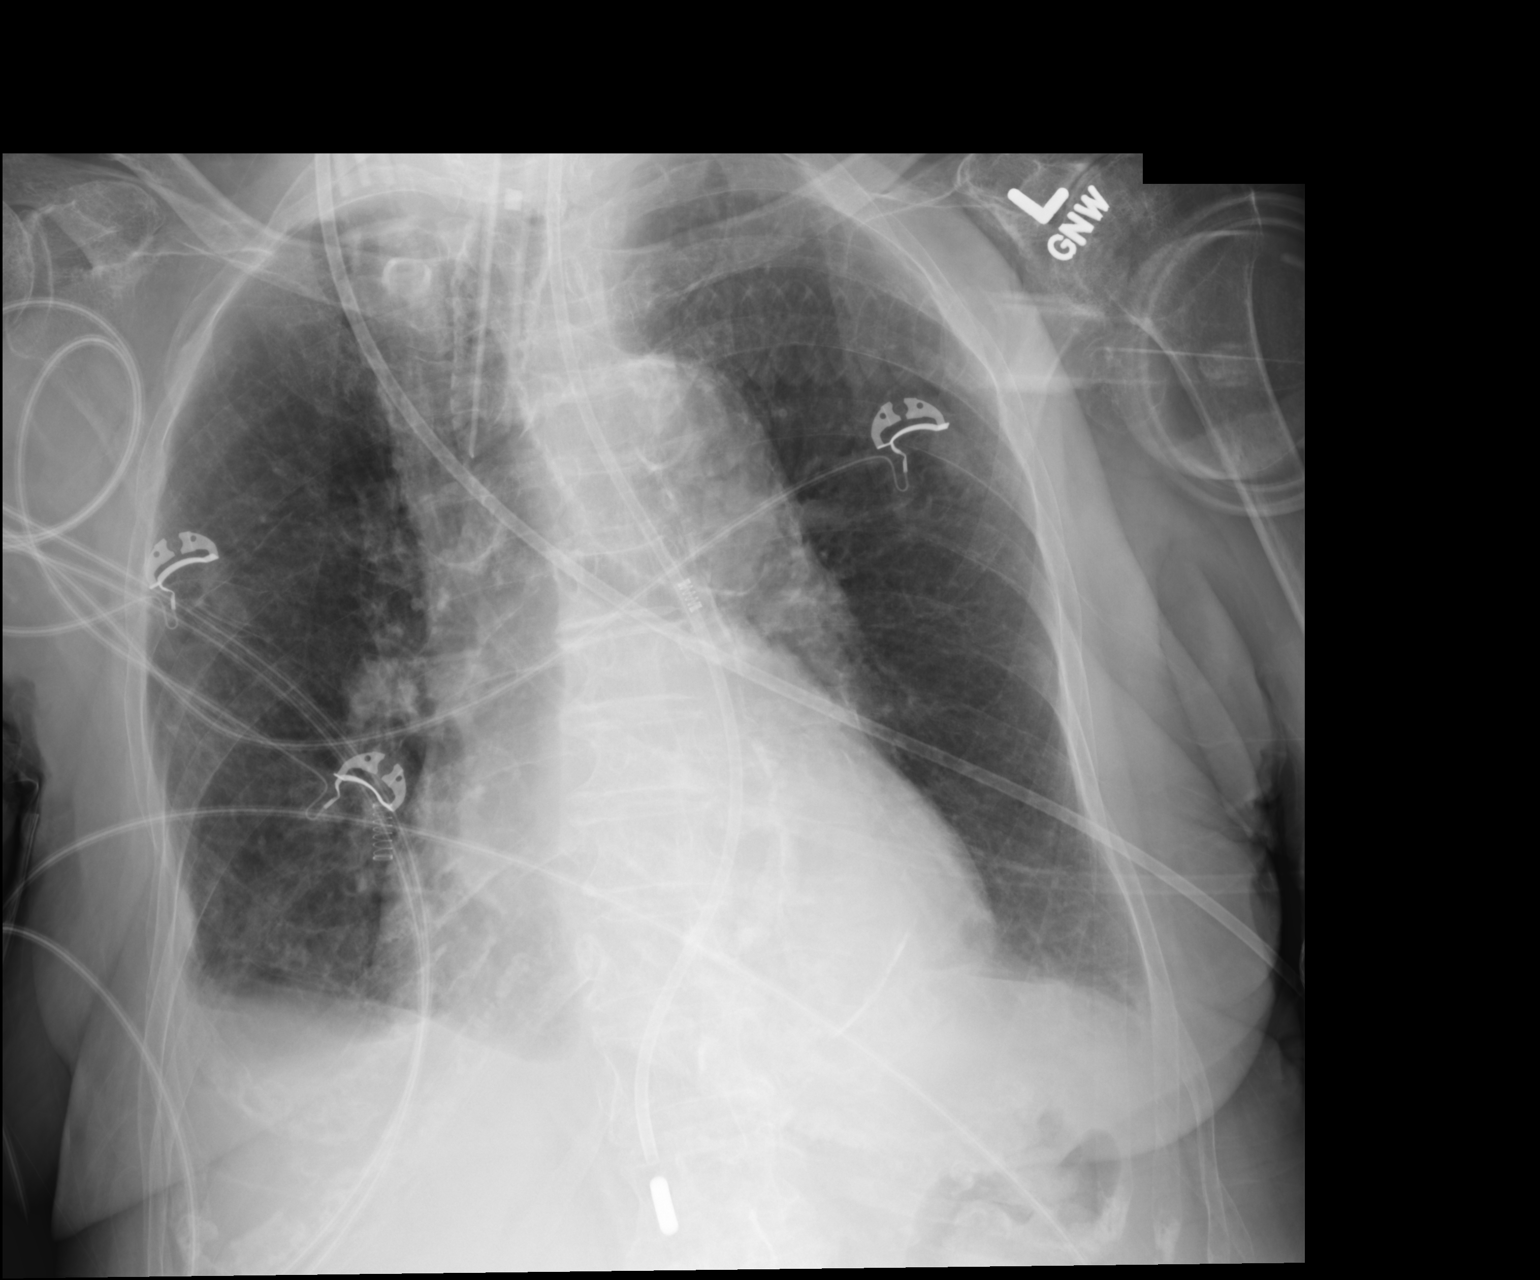

[1 of 1 positions shown; findings below may reference images not displayed]

FINDINGS: Chronic interstitial markings/emphysematous changes. No focal
consolidation. Trace right pleural effusion. No pneumothorax.

Cardiomegaly.

Endotracheal tube terminates 3.5 cm above the carina.

Enteric tube terminates at the GE junction.
IMPRESSION: Endotracheal tube terminates 3.5 cm above the carina.

Enteric tube terminates at the GE junction.
